# Patient Record
Sex: Female | Born: 1970 | Race: White | Hispanic: No | Marital: Married | Smoking: Never smoker
Health system: Southern US, Community
[De-identification: ages and names within clinical notes are randomized; demographics above are authoritative.]

## PROBLEM LIST (undated history)

## (undated) DIAGNOSIS — K2270 Barrett's esophagus without dysplasia: Secondary | ICD-10-CM

## (undated) DIAGNOSIS — K219 Gastro-esophageal reflux disease without esophagitis: Secondary | ICD-10-CM

## (undated) DIAGNOSIS — F410 Panic disorder [episodic paroxysmal anxiety] without agoraphobia: Secondary | ICD-10-CM

## (undated) DIAGNOSIS — E559 Vitamin D deficiency, unspecified: Secondary | ICD-10-CM

## (undated) DIAGNOSIS — M722 Plantar fascial fibromatosis: Secondary | ICD-10-CM

## (undated) DIAGNOSIS — M1990 Unspecified osteoarthritis, unspecified site: Secondary | ICD-10-CM

## (undated) DIAGNOSIS — F419 Anxiety disorder, unspecified: Secondary | ICD-10-CM

## (undated) HISTORY — PX: TONSILLECTOMY AND ADENOIDECTOMY: SUR1326

## (undated) HISTORY — PX: JOINT REPLACEMENT: SHX530

## (undated) HISTORY — DX: Vitamin D deficiency, unspecified: E55.9

## (undated) HISTORY — DX: Barrett's esophagus without dysplasia: K22.70

## (undated) HISTORY — PX: BREAST BIOPSY: SHX20

## (undated) HISTORY — DX: Gastro-esophageal reflux disease without esophagitis: K21.9

## (undated) HISTORY — PX: WISDOM TOOTH EXTRACTION: SHX21

## (undated) HISTORY — PX: TUBAL LIGATION: SHX77

---

## 1995-02-06 HISTORY — PX: ELBOW SURGERY: SHX618

## 1998-02-05 HISTORY — PX: CHOLECYSTECTOMY: SHX55

## 2000-02-06 HISTORY — PX: FRACTURE SURGERY: SHX138

## 2001-02-28 ENCOUNTER — Emergency Department (HOSPITAL_COMMUNITY): Admission: AC | Admit: 2001-02-28 | Discharge: 2001-02-28 | Payer: Self-pay

## 2002-03-19 ENCOUNTER — Other Ambulatory Visit: Admission: RE | Admit: 2002-03-19 | Discharge: 2002-03-19 | Payer: Self-pay | Admitting: Obstetrics & Gynecology

## 2002-10-16 ENCOUNTER — Encounter (INDEPENDENT_AMBULATORY_CARE_PROVIDER_SITE_OTHER): Payer: Self-pay

## 2002-10-16 ENCOUNTER — Inpatient Hospital Stay (HOSPITAL_COMMUNITY): Admission: RE | Admit: 2002-10-16 | Discharge: 2002-10-19 | Payer: Self-pay | Admitting: Obstetrics & Gynecology

## 2002-11-11 ENCOUNTER — Other Ambulatory Visit: Admission: RE | Admit: 2002-11-11 | Discharge: 2002-11-11 | Payer: Self-pay | Admitting: Obstetrics & Gynecology

## 2006-04-26 ENCOUNTER — Inpatient Hospital Stay (HOSPITAL_COMMUNITY): Admission: RE | Admit: 2006-04-26 | Discharge: 2006-04-29 | Payer: Self-pay | Admitting: Obstetrics & Gynecology

## 2007-02-06 HISTORY — PX: LAPAROSCOPIC GASTRIC BANDING: SHX1100

## 2007-02-13 ENCOUNTER — Ambulatory Visit (HOSPITAL_COMMUNITY): Admission: RE | Admit: 2007-02-13 | Discharge: 2007-02-13 | Payer: Self-pay | Admitting: Surgery

## 2007-02-24 ENCOUNTER — Ambulatory Visit (HOSPITAL_COMMUNITY): Admission: RE | Admit: 2007-02-24 | Discharge: 2007-02-24 | Payer: Self-pay | Admitting: Surgery

## 2007-02-27 ENCOUNTER — Encounter: Admission: RE | Admit: 2007-02-27 | Discharge: 2007-05-28 | Payer: Self-pay | Admitting: Surgery

## 2007-04-11 ENCOUNTER — Ambulatory Visit (HOSPITAL_COMMUNITY): Admission: RE | Admit: 2007-04-11 | Discharge: 2007-04-11 | Payer: Self-pay | Admitting: Surgery

## 2007-04-28 ENCOUNTER — Encounter (INDEPENDENT_AMBULATORY_CARE_PROVIDER_SITE_OTHER): Payer: Self-pay | Admitting: Surgery

## 2007-04-28 ENCOUNTER — Ambulatory Visit (HOSPITAL_COMMUNITY): Admission: RE | Admit: 2007-04-28 | Discharge: 2007-04-29 | Payer: Self-pay | Admitting: Surgery

## 2007-06-18 ENCOUNTER — Encounter: Admission: RE | Admit: 2007-06-18 | Discharge: 2007-06-18 | Payer: Self-pay | Admitting: Obstetrics and Gynecology

## 2008-08-23 ENCOUNTER — Ambulatory Visit: Payer: Self-pay | Admitting: Diagnostic Radiology

## 2008-08-23 ENCOUNTER — Emergency Department (HOSPITAL_BASED_OUTPATIENT_CLINIC_OR_DEPARTMENT_OTHER): Admission: EM | Admit: 2008-08-23 | Discharge: 2008-08-23 | Payer: Self-pay | Admitting: Emergency Medicine

## 2008-08-26 ENCOUNTER — Encounter: Admission: RE | Admit: 2008-08-26 | Discharge: 2008-08-26 | Payer: Self-pay | Admitting: Surgery

## 2008-10-04 IMAGING — RF DG UGI W/ KUB
14 of 23 series · 14 of 23 positions shown · non-contrast
Comparison: none

CLINICAL DATA: Bariatric screening. History of hiatal hernia and ulcers. Patient on medications for acid reflux.
 UPPER GI SERIES WITH KUB - HIGH DENSITY:

[Series 1: run · 1 of 1 slices shown (1 of 10)]
[im 1/1]
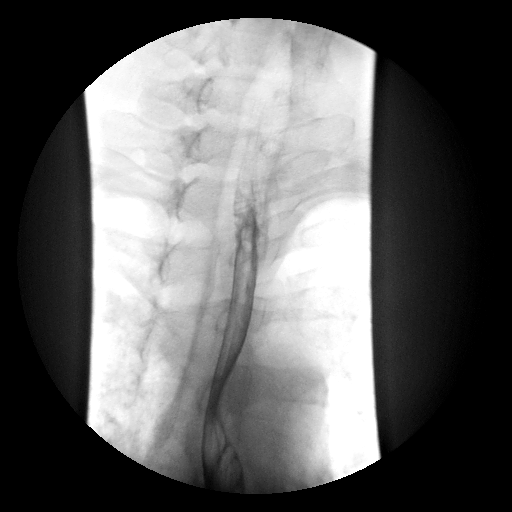

[Series 3: run · 1 of 1 slices shown (2 of 10)]
[im 1/1]
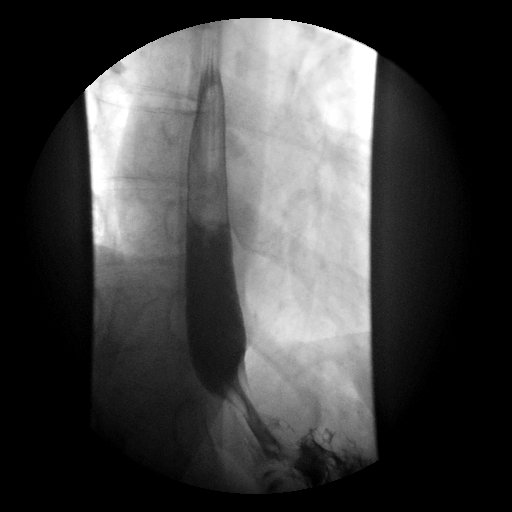

[Series 5: run · 1 of 1 slices shown (3 of 10)]
[im 1/1]
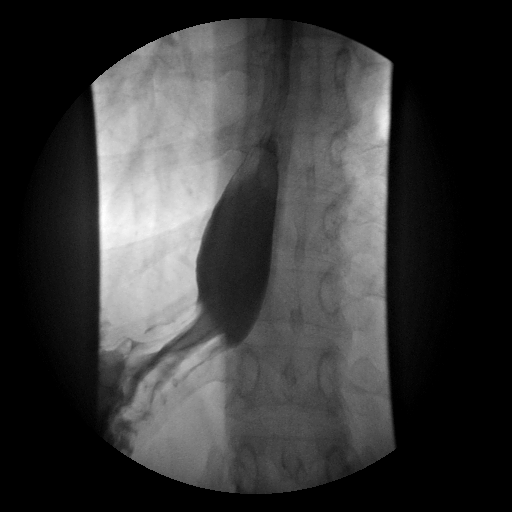

[Series 6: run · 1 of 1 slices shown (4 of 10)]
[im 1/1]
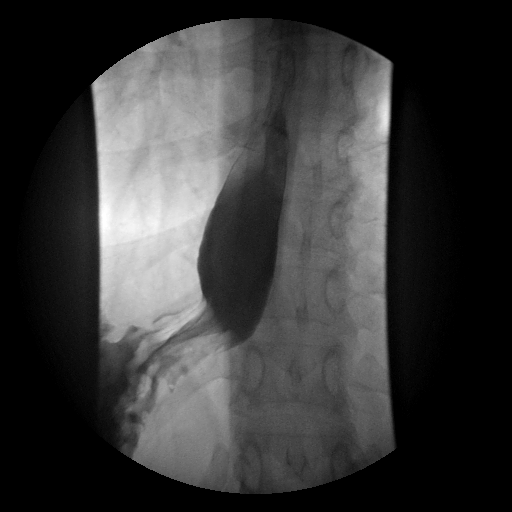

[Series 8: run · 1 of 1 slices shown (5 of 10)]
[im 1/1]
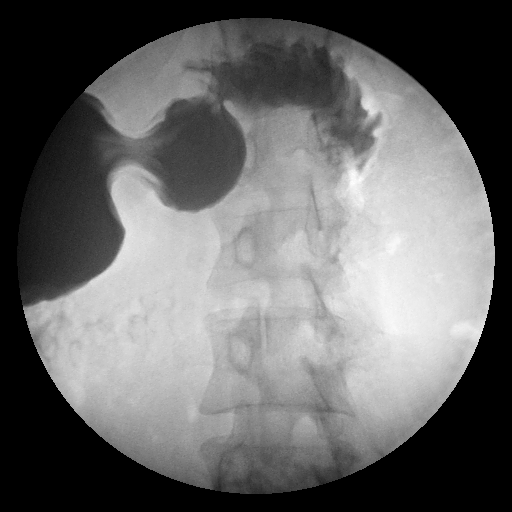

[Series 10: run · 1 of 1 slices shown (6 of 10)]
[im 1/1]
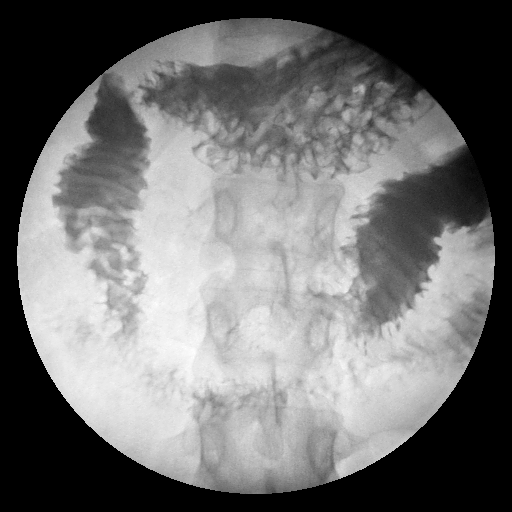

[Series 11: run · 1 of 1 slices shown (7 of 10)]
[im 1/1]
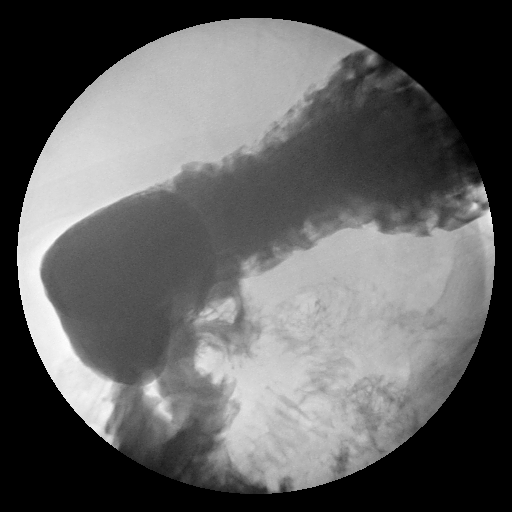

[Series 13: run · 1 of 1 slices shown (8 of 10)]
[im 1/1]
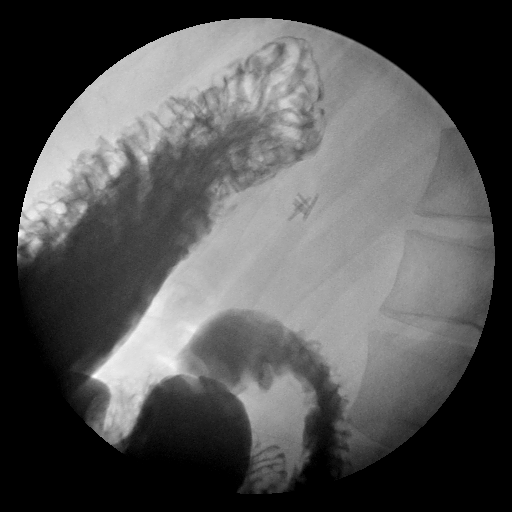

[Series 14: run · 1 of 1 slices shown (9 of 10)]
[im 1/1]
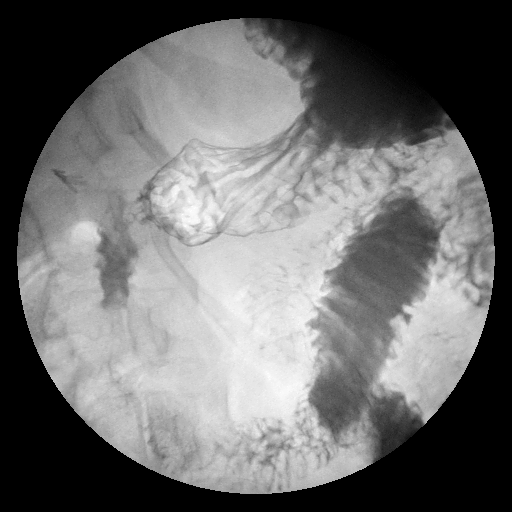

[Series 16: run · 1 of 1 slices shown (10 of 10)]
[im 1/1]
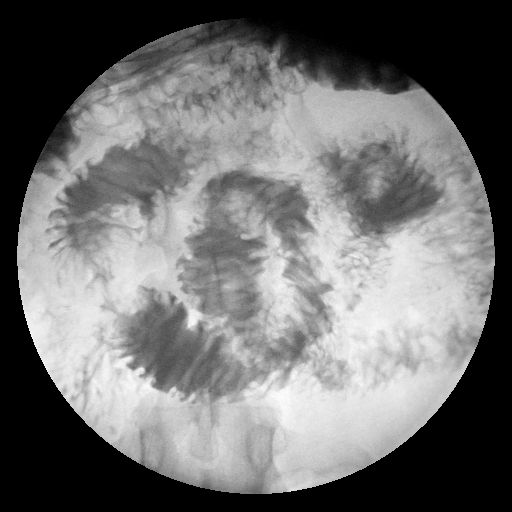

[Series 1002: view not recorded · 0.20mm/px · 1 of 1 slices shown (1 of 4)]
[im 1/1]
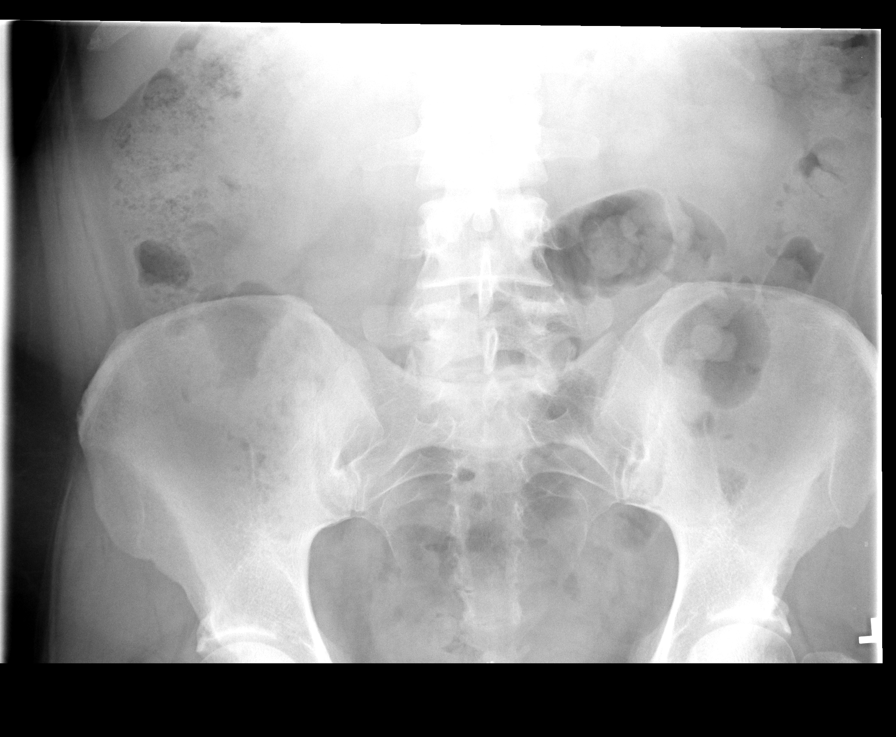

[Series 1003: view not recorded · 0.20mm/px · 1 of 1 slices shown (2 of 4)]
[im 1/1]
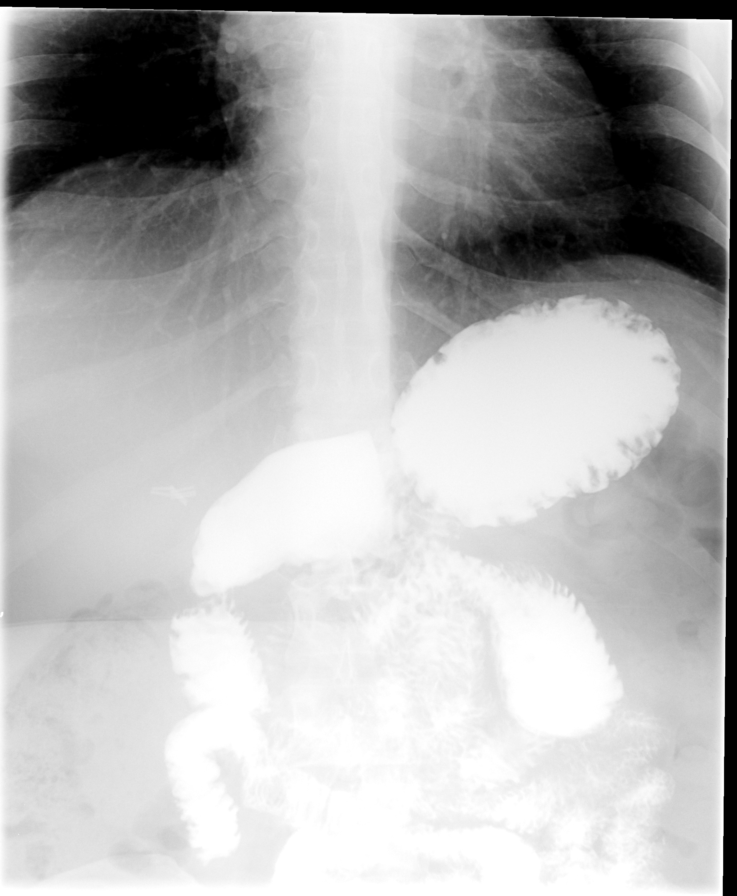

[Series 1005: view not recorded · 0.20mm/px · 1 of 1 slices shown (3 of 4)]
[im 1/1]
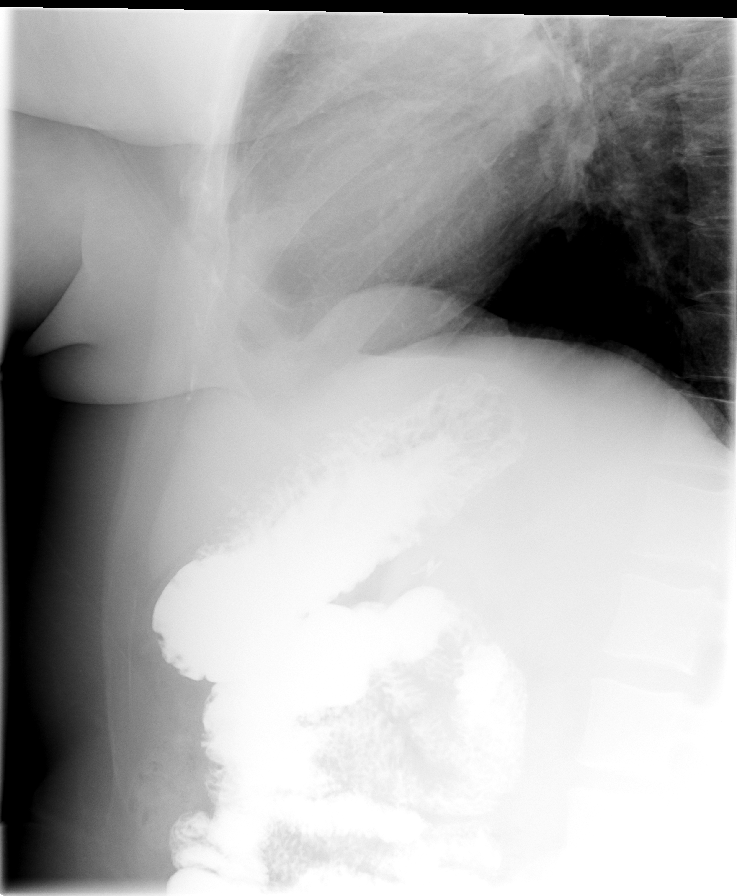

[Series 1007: view not recorded · 0.20mm/px · 1 of 1 slices shown (4 of 4)]
[im 1/1]
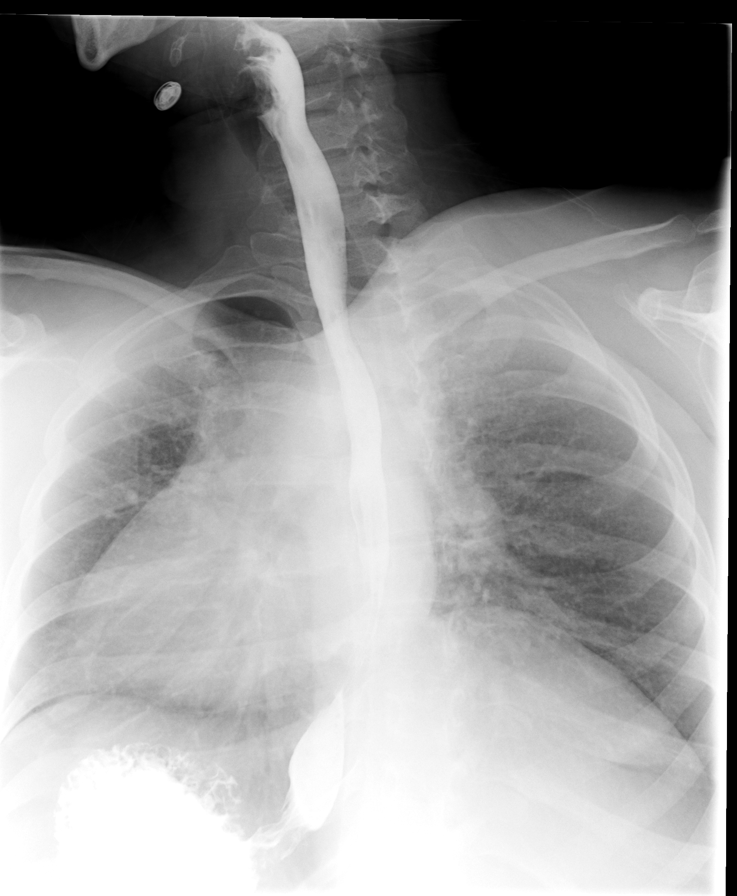

[14 of 23 positions shown; findings below may reference images not displayed]

FINDINGS: Scout film ? A nonobstructive bowel gas pattern is seen. No abnormal calcifications are noted.  Surgical clips are identified in the right upper quadrant post cholecystectomy and a surgical clip is seen in the central portion of the lower pelvis of questionable etiology. The bony structures appear intact. 
 The patient was able to swallow without difficulty. The esophageal mucosa and motility appeared within normal limits.  No evidence for hiatal hernia or gastroesophageal reflux was seen even with elicitation maneuvers. The gastric mucosa was well coated, and showed no evidence for focal abnormality.  Motility through the stomach and pylorus and duodenum appeared unremarkable.  The duodenal bulb was well distended and has a normal appearance as did the visualized portion of the small bowel to the level of the mid jejunum.
IMPRESSION: Normal Upper GI.

## 2009-04-22 ENCOUNTER — Encounter: Admission: RE | Admit: 2009-04-22 | Discharge: 2009-04-22 | Payer: Self-pay | Admitting: Surgery

## 2010-02-05 NOTE — L&D Delivery Note (Signed)
Cesarean Section Procedure Note with modified Pomeroy bilateral tubal sterilization:  Indications: previous uterine incision kerr x2                       Pre-operative Diagnosis: 39 week 3 day pregnancy.  Desires sterilization  Post-operative Diagnosis: same  Surgeon: Genia Del   Assistants: Marlinda Mike  Anesthesia: Spinal anesthesia  ASA Class: 2   Procedure Details   The patient was seen in the Holding Room. The risks, benefits, complications, treatment options, and expected outcomes were discussed with the patient.  The patient concurred with the proposed plan, giving informed consent.  The site of surgery properly noted/marked. The patient was taken to Operating Room # 9, identified as Brianna Allen and the procedure verified as C-Section Delivery. A Time Out was held and the above information confirmed.  After induction of anesthesia, the patient was draped and prepped in the usual sterile manner. A Pfannenstiel incision was made and carried down through the subcutaneous tissue to the fascia. Fascial incision was made and extended transversely. The fascia was separated from the underlying rectus tissue superiorly and inferiorly. The peritoneum was identified and entered. Peritoneal incision was extended longitudinally. The utero-vesical peritoneal reflection was incised transversely and the bladder flap was bluntly freed from the lower uterine segment. A low transverse uterine incision was made. Delivered from cephalic presentation was a 9+ Lbs Female with Apgar scores of 9 at one minute and 9 at five minutes. After the umbilical cord was clamped and cut cord blood was obtained for evaluation. The placenta was removed intact and appeared normal. The uterine outline, tubes and ovaries appeared normal. The uterine incision was closed with running locked sutures of Vicryl 0.  A second layer with a mattress suture of Vicryl 0 was added. Hemostasis was observed.  A bilateral  sterilization of the tubes was done by modified Pomeroy technique with plain.  Lavage was carried out until clear.  The peritoneum was closed with a running suture of Vicryl 2-0. The fascia was then reapproximated with running sutures of Vicryl 0.  The adipose tissue was reapproximated with a running plain suture. The skin was reapproximated with Staples.  Instrument, sponge, and needle counts were correct prior the abdominal closure and at the conclusion of the case.   Findings: Normal placenta  Estimated Blood Loss:  800 cc         Drains: none         Total IV Fluids:  3200 ml         Specimens: Placenta          Complications:  None; patient tolerated the procedure well.         Disposition: PACU - hemodynamically stable.         Condition: stable  Attending Attestation: I was present and scrubbed for the entire procedure.   Dr Genia Del.  01/10/11 at 12:55 pm

## 2010-05-14 LAB — COMPREHENSIVE METABOLIC PANEL
AST: 16 U/L (ref 0–37)
Albumin: 4.5 g/dL (ref 3.5–5.2)
BUN: 7 mg/dL (ref 6–23)
Calcium: 9.7 mg/dL (ref 8.4–10.5)
Chloride: 106 mEq/L (ref 96–112)
Creatinine, Ser: 0.7 mg/dL (ref 0.4–1.2)
GFR calc non Af Amer: >60 mL/min (ref >60)
Total Bilirubin: 0.6 mg/dL (ref 0.3–1.2)

## 2010-05-14 LAB — DIFFERENTIAL
Basophils Absolute: 0 10*3/uL (ref 0.0–0.1)
Basophils Relative: 1 % (ref 0–1)
Lymphocytes Relative: 22 % (ref 12–46)
Monocytes Absolute: 0.3 10*3/uL (ref 0.1–1.0)
Monocytes Relative: 5 % (ref 3–12)
Neutro Abs: 4.8 10*3/uL (ref 1.7–7.7)
Neutrophils Relative %: 72 % (ref 43–77)

## 2010-05-14 LAB — CBC
HCT: 43.8 % (ref 36.0–46.0)
Hemoglobin: 14.7 g/dL (ref 12.0–15.0)
MCV: 92.1 fL (ref 78.0–100.0)
Platelets: 230 10*3/uL (ref 150–400)
RDW: 12.6 % (ref 11.5–15.5)

## 2010-06-07 LAB — ABO/RH: RH Type: POSITIVE

## 2010-06-07 LAB — RUBELLA ANTIBODY, IGM: Rubella: IMMUNE

## 2010-06-08 LAB — ANTIBODY SCREEN: Antibody Screen: NEGATIVE

## 2010-06-08 LAB — HIV ANTIBODY (ROUTINE TESTING W REFLEX): HIV: NONREACTIVE

## 2010-06-20 NOTE — Op Note (Signed)
NAMEELLOWYN, RIEVES             ACCOUNT NO.:  [id number]   MEDICAL RECORD NO.:  [id number]          PATIENT TYPE:  AMB   LOCATION:  DAY                          FACILITY:  [facility]   PHYSICIAN:  Sandria Bales. Ezzard Standing, M.D.  DATE OF BIRTH:  January 04, 1971   DATE OF PROCEDURE:  04/28/2007  DATE OF DISCHARGE:                               OPERATIVE REPORT   Operative date here?   PREOPERATIVE DIAGNOSIS:  Morbid obesity (weight of 254, body mass index  of 40), 1 cm right breast skin lesion.   POSTOPERATIVE DIAGNOSIS:  Morbid obesity (weight 254, body mass index  40), 1 cm right breast skin lesion.   PROCEDURE:  Laparoscopic band (AP standard), excision of right breast  skin lesion.   SURGEON:  Sandria Bales. Ezzard Standing, M.D.   ASSISTANT:  Sharlet Salina T. Hoxworth, M.D.   ANESTHESIA:  General endotracheal with approximately 30 mL of 1%  Xylocaine.   COMPLICATIONS:  None.   PROCEDURE:  Ms. Brianna Allen is a 40 year old white female, a patient of  Lori Bean, P.A.,  who has been morbidly obese much of her adult life.  She has been through our preoperative bariatric program.  She is  interested in a laparoscopic banding procedure.  The indications and  potential complications of the procedure were explained to the patient.   Potential complications of the lap band were discussed.  These include,  but are not limited to, bleeding, infection, band slippage and erosion,  and long-term nutritional consequences.  She also has a 1 cm skin lesion  on her right breast at the 10 o'clock position she wanted excised at the  same time, and the risks of that include bleeding and infection.   OPERATIVE NOTE:  The patient was placed in the supine position and given  a general endotracheal anesthetic.  She was given 1 gram of Ancef at  initiation of procedure.  Her abdomen was prepped with Techni-Care.  She  had PAS stockings in place.  A timeout was held, identifying the patient  and the procedure.  I had marked the right  breast where the skin lesion  was preoperatively.   I accessed her abdominal cavity through the left upper quadrant using  Optiview 11-mm Ethicon trocar.  I then placed five additional trocars, a  5 mm subxiphoid trocar for the liver retractor, a 15 mm trocar in the  right subcostal margin, an 11 mm right paramedian, an 11 mm left  paramedian, and a 5 mm left lateral subcostal trocar.   Abdominal exploration was carried out.  The patient had a moderate  amount of omentum.  I could see her left ovary which was really  unremarkable.  I could see the top of the uterus.  Her right and left  lobe of the liver were unremarkable.  Her stomach was unremarkable.   I then turned my attention to the stomach.  I made an incision along the  gastroesophageal angle on the left side at the angle of Hiss and  developed this along the left crus.  I then went through the  gastrohepatic ligament, identified the right crus,  and went down to  where the fatty fibers decussate across the crus and entered this area.  Then I passed the finger dissector through in a retrogastric function up  to the angle of Hiss.   I then introduced the AP standard band.  I passed the band through the  finger and pulled it around the proximal stomach.   Before cinching it down, I passed the sizer down the esophagus and  singed the band over the sizer and it seemed to fit okay.  The sizer was  then removed.  I then imbricated the stomach over the band using three  sutures of 2-0 Ethibond suture.   There was a little bruising on the first suture, but I thought I got  good bites and this covered the band well.   I then re-inspected the band and its location.  I pulled the tubing out  through the right paramedian incision.  I then removed the liver  retractor and all the other retractors in turn.  I pulled the tubing  attachment to the reservoir device.  I then sewed the reservoir device  with 2-0 Prolene sutures in four  quarters on to the rectus fascia.  I  measured the amount of fluid in the band.  There up 2.5 mL of fluid in  the band.  So I just pulled some of the air out but I reinserted that  fluid.   I then closed the subcutaneous tissue with 3-0 Vicryl suture and the  skin with 5-0 Monocryl suture and painted each wound with Dermabond.   I then turned my attention to the right breast which was prepped with  Betadine solution.  This was a little papilloma and I just snipped off  the bottom and burned the base with Bovie cautery.  I did send the  specimen for pathology.   The patient tolerated the procedure well and was transported to the  recovery room in good condition.  Sponge and needle count were correct.      Sandria Bales. Ezzard Standing, M.D.  Electronically Signed     DHN/MEDQ  D:  04/28/2007  T:  04/28/2007  Job:  [id number]   cc:   Genia Del, M.D.  Fax: [phone]   Jilda Roche, P.A.

## 2010-06-23 NOTE — Op Note (Signed)
Brianna Allen, Brianna Allen             ACCOUNT NO.:  [id number]   MEDICAL RECORD NO.:  [id number]          PATIENT TYPE:  INP   LOCATION:  9127                          FACILITY:  [facility]   PHYSICIAN:  Genia Del, M.D.DATE OF BIRTH:  09/22/1970   DATE OF PROCEDURE:  DATE OF DISCHARGE:                               OPERATIVE REPORT   PREOPERATIVE DIAGNOSES:  38 plus weeks gestation, previous C-section.   POSTOPERATIVE DIAGNOSES:  38 plus weeks gestation, previous C-section.   PROCEDURE:  Repeat elective low transverse C-section.   SURGEON:  Dr. Genia Del.   ASSISTANT:  No assistant.   ANESTHESIOLOGIST:  Octaviano Glow. Pamalee Leyden, M.D.   DESCRIPTION OF PROCEDURE:  Under spinal anesthesia with a 15 degree left  decubitus position, the patient is prepped with Betadine on the  abdominal suprapubic vulvar and vaginal areas.  The bladder is  catheterized and the patient is draped as usual.  We verify the level of  anesthesia which is adequate.  We then infiltrate the subcutaneous  tissue with Marcaine one-quarter plain, 20 mL at the site of the  previous C-section scar.  We then make a Pfannenstiel incision at the  site of the previous C-section scar.  We opened the adipose tissue with  the scalpel.  We open the aponeurosis with the Bovie and the Mayo  scissors.  We then separate the recti muscles from the aponeurosis on  the midline.  We opened the parietoperitoneum longitudinally with [employer]  scissors.  We opened the visceral peritoneum over the lower uterine  segment transversely with Metz scissors and reclined the bladder  downward.  The bladder retractor is put in place.  We make a low  transverse hysterotomy over the lower uterine segment with a scalpel  extension on each side with dressing scissors.  The amniotic fluid is  clear.  The fetus is in cephalic presentation.  The birth of a baby girl  at 12:24. The cord is clamped and cut. The baby is suctioned and given  to the  neonatal team.  Apgar's are 9 and 9. Weight is 8 pounds 9 ounces.  The placenta is evacuated spontaneously and given for cord blood  banking. The uterine revision is done Pitocin is started in the IV  fluid.  The uterus contracts well. Ancef  1 gram IV is given.  We then  close the hysterotomy in a first locked running suture of #0 Vicryl,  a  second layer in a mattress fashion is done with a #0 Vicryl. We make a  figure-of-eight #0 Vicryl stitch on the right aspect of the incision to  complete hemostasis.  Both tubes and both ovaries are normal to  inspection.  We then irrigate and suction the abdominopelvic cavity.  We  complete hemostasis with the electrocautery at the level of the bladder  flap and at the recti muscles.  We then close the aponeurosis in two  half running sutures of #0 Vicryl.  Hemostasis is completed on the  adipose tissue with the electrocautery and the skin is reapproximated  with  staples.  A dry dressing is applied.  The  counts of the sponges and  instruments was complete x2.  The estimated blood loss was 800 mL.  No  complications occurred and the patient was brought to the recovery room  in good stable status.      Genia Del, M.D.  Electronically Signed     ML/MEDQ  D:  04/26/2006  T:  04/26/2006  Job:  [id number]

## 2010-06-23 NOTE — Discharge Summary (Signed)
NAMEJERNEY, Brianna Allen             ACCOUNT NO.:  [id number]   MEDICAL RECORD NO.:  [id number]         PATIENT TYPE:  INP   LOCATION:  9304                          [facility]ACILITY:  [facility]   PHYSICIAN:  Genia Del, M.D.DATE OF BIRTH:  09/19/1976   DATE OF ADMISSION:  04/28/2006  DATE OF DISCHARGE:  04/29/2006                               DISCHARGE SUMMARY   ADMISSION DIAGNOSES:  1. Thirty-eight week plus gestation.  2. Previous cesarean section.   DISCHARGE DIAGNOSES:  1. Thirty-eight week plus gestation.  2. Previous cesarean section.   INTERVENTION:  Repeat elective low transverse cesarean section.   HOSPITAL COURSE:  The patient had her C-section on April 26, 2006.  She  had a baby girl.  No complication with the C-section.  Estimated blood  loss 800 mL.  Ancef 1 g IV was given during the surgery.  The postop  course was unremarkable.  She remained afebrile and hemodynamically  stable.  She was discharged to home on postop day #3 in good, stable  status.  Postoperative advice was given.  The patient will follow up for  staple removal on day 5-7 postop and then will have a postop visit at 6  weeks.      Genia Del, M.D.  Electronically Signed     ML/MEDQ  D:  06/20/2006  T:  06/20/2006  Job:  [id number]

## 2010-06-23 NOTE — Discharge Summary (Signed)
Brianna, Allen             ACCOUNT NO.:  [id number]   MEDICAL RECORD NO.:  [id number]         PATIENT TYPE:  INP   LOCATION:  9304                          [facility]ACILITY[facility]   PHYSICIAN:  Genia Del, M.D.DATE OF BIRTH:  09/19/1976   DATE OF ADMISSION:  04/28/2006  DATE OF DISCHARGE:  04/29/2006                               DISCHARGE SUMMARY   I cut myself short at the end of the dictation, so I wanted to add that  her post-op hemoglobin was 11.1 with a hematocrit of 32.7, that she was  discharged in stable status, and post-op advice were given.  She will  follow up for staple removal day 5 to 7 and then for post-op visit at 6  weeks at [facility].      Genia Del, M.D.  Electronically Signed     ML/MEDQ  D:  06/20/2006  T:  06/20/2006  Job:  [id number]

## 2010-06-23 NOTE — Discharge Summary (Signed)
   NAME:  Brianna Allen, Brianna Allen                       ACCOUNT NO.:  [id number]   MEDICAL RECORD NO.:  [id number]                   PATIENT TYPE:  INP   LOCATION:  9141                                 [facility]ACILITY[facility]   PHYSICIAN:  Genia Del, M.D.             DATE OF BIRTH:  May 07, 1970   DATE OF ADMISSION:  10/16/2002  DATE OF DISCHARGE:  10/19/2002                                 DISCHARGE SUMMARY   ADMISSION DIAGNOSES:  38 weeks and 6 days gestation with suspicion of severe  macrosomia.   DISCHARGE DIAGNOSES:  38 weeks and 6 days gestation with suspicion of severe  macrosomia. Concern large for gestational age at 9 pounds.   INTERVENTION:  Primary elective low transverse cesarean section.   HOSPITAL COURSE:  Brianna Allen is a 40 year old who was admitted at 63 weeks  and 6 days gestation with suspicion of macrosomia at more than the 99th  percentile by ultrasound. A primary elective cesarean section was decided.  Her presentation was unfavorable with a closed, long, and high presentation.  She had an elective low transverse cesarean section on October 16, 2002  under spinal anesthesia. She had a baby girl weighing 9 pounds, Apgar's 9  and 9. Cord blood was taken for blood bank. Her estimated blood loss was 1  liter. No complications occurred. Postoperative evaluation was unremarkable.  She remained afebrile and hemodynamically stable.   LABORATORY DATA:  Her postoperative hemoglobin was 10.7, hematocrit 30.5.   DISPOSITION:  She was discharged home on postoperative day 3 in stable  status. Postoperative advice given.   DISCHARGE MEDICATIONS:  She was prescribed Tylox and Motrin p.r.n.   FOLLOW UP:  She will followup at [facility] in 4 weeks.                                               Genia Del, M.D.    ML/MEDQ  D:  11/12/2002  T:  11/12/2002  Job:  [id number]

## 2010-06-23 NOTE — Op Note (Signed)
NAME:  Brianna Allen, Brianna Allen                       ACCOUNT NO.:  [id number]   MEDICAL RECORD NO.:  [id number]                   PATIENT TYPE:  INP   LOCATION:  9141                                 [facility]ILITY[facility]   PHYSICIAN:  Genia Del, M.D.             DATE OF BIRTH:  [birth year]   DATE OF PROCEDURE:  10/16/2002  DATE OF DISCHARGE:                                 OPERATIVE REPORT   PREOPERATIVE DIAGNOSIS:  A 38-weeks and six days gestation, with suspicion  of severe macrosomia.   POSTOPERATIVE DIAGNOSES:  1. A 38-weeks and six days gestation, with suspicion of severe macrosomia.  2. Delivery of baby girl, 9 pounds.   INTERVENTION:  Primary elective low transverse cesarean section.   SURGEON:  Genia Del, M.D.   ASSISTANT:  Derek Jack.   ANESTHESIOLOGIST:  Quillian Quince, M.D.   DESCRIPTION OF PROCEDURE:  Under spinal anesthesia, with 15-degree left  decubitus position, the patient is prepped with Hibiclens on the abdominal,  suprapubic, vulva and vaginal areas.  The bladder catheter is inserted and  the patient is draped as usual.  An infiltration of 10 cc Marcaine 0.25%  plain is done at the incision site subcutaneously.  We then do a  Pfannenstiel incision with a scalpel.  We opened the aponeurosis  transversely with the Mayo scissors.  The aponeurosis is separated from the  recti muscles on the midline superiorly and inferiorly.  We then opened the  parietoperitoneum bluntly.  We put the bladder retractor in place.  The  visceroperitoneum is opened transversely with Metzenbaum scissors over the  lower uterine segment.  The bladder is reclined downwards.  The bladder  retractor is repositioned.  We opened a low transverse hysterotomy with the  scalpel, and then elongated on each side with scissors.   The amniotic fluid is clear.  The baby is in cephalic presentation.  A baby  girl.  The cord is clamped and cut.  The baby is given to the neonatal team.  Apgars are 9 and 9.  The weight is 9 pounds.  Cord blood bank is taken, and  then cord blood is done.  We have a spontaneous evacuation of the placenta,  which is sent to pathology.  We then do a uterine revision.  The uterus is  contracting well with Pitocin, and Cefotetan 1 g is given.  The ____________  is closed in a closed locked running suture of 0 Vicryl.  A second plane in  a mattress fashion is done with 0 Vicryl.  Hemostasis is adequate.  Both  tubes and ovaries are normal bilaterally.  The uterus is put back in place.  Irrigation and suction is done of the abdominal pelvic cavity.  Hemostasis  is verified over the lower uterine segment.  The bladder flap at recti  muscles is completed with the electrocautery when necessary.  The  aponeurosis is then closed in two half running  suture of 0 Vicryl.  Hemostasis is completed at the level of the adipose tissue with the  electrocautery.  The skin is reapproximated with staples.  A dry dressing is  applied.  The count of instruments and gauzes was complete x2.   ESTIMATED BLOOD LOSS:  1000 cc.    COMPLICATIONS:  None occurs.   DISPOSITION:  The patient was transferred to the recovery room in  satisfactory condition.                                               Genia Del, M.D.    ML/MEDQ  D:  10/16/2002  T:  10/17/2002  Job:  [id number]

## 2010-10-30 LAB — CBC
Platelets: 219
RDW: 13.7
WBC: 7.8

## 2010-10-30 LAB — DIFFERENTIAL
Basophils Absolute: 0
Lymphocytes Relative: 32
Lymphs Abs: 2.5
Neutro Abs: 4.5

## 2010-10-30 LAB — PREGNANCY, URINE

## 2010-10-30 LAB — HEMOGLOBIN AND HEMATOCRIT, BLOOD: HCT: 39.3

## 2010-12-21 ENCOUNTER — Encounter (HOSPITAL_COMMUNITY): Payer: Self-pay | Admitting: Pharmacy Technician

## 2010-12-26 ENCOUNTER — Encounter (HOSPITAL_COMMUNITY): Payer: Self-pay

## 2011-01-01 ENCOUNTER — Encounter (HOSPITAL_COMMUNITY): Payer: Self-pay

## 2011-01-01 ENCOUNTER — Encounter (HOSPITAL_COMMUNITY)
Admission: RE | Admit: 2011-01-01 | Discharge: 2011-01-01 | Disposition: A | Discharge status: Home / Self Care | Payer: BC Managed Care – PPO | Source: Ambulatory Visit | Attending: Obstetrics & Gynecology | Admitting: Obstetrics & Gynecology

## 2011-01-01 ENCOUNTER — Other Ambulatory Visit: Payer: Self-pay | Admitting: Obstetrics & Gynecology

## 2011-01-01 HISTORY — DX: Anxiety disorder, unspecified: F41.9

## 2011-01-01 HISTORY — DX: Panic disorder (episodic paroxysmal anxiety): F41.0

## 2011-01-01 HISTORY — DX: Unspecified osteoarthritis, unspecified site: M19.90

## 2011-01-01 LAB — SURGICAL PCR SCREEN: MRSA, PCR: NEGATIVE

## 2011-01-01 LAB — CBC
HCT: 42.3 % (ref 36.0–46.0)
Hemoglobin: 14.7 g/dL (ref 12.0–15.0)
MCH: 31.5 pg (ref 26.0–34.0)
MCHC: 34.8 g/dL (ref 30.0–36.0)
MCV: 90.6 fL (ref 78.0–100.0)

## 2011-01-01 NOTE — Patient Instructions (Addendum)
   Your procedure is scheduled on:Wednesday,  December 5th  Enter through the [employer] [facility] at:9:30am Pick up the phone at the desk and dial [phone] and inform us of your arrival.  Please call this number if you have any problems the morning of surgery: [phone]  Remember: Do not eat food after midnight:Tuesday Do not drink clear liquids after:Midnight Tuesday Take these medicines the morning of surgery with a SIP OF WATER:none  Do not wear jewelry, make-up, or FINGER nail polish Do not wear lotions, powders, or perfumes.  You may not  wear deodorant. Do not shave 48 hours prior to surgery. Do not bring valuables to the hospital.  Leave suitcase in the car. After Surgery it may be brought to your room. For patients being admitted to the hospital, checkout time is 11:00am the day of discharge.  Remember to use your hibiclens as instructed.Please shower with 1/2 bottle the evening before your surgery and the other 1/2 bottle the morning of surgery.

## 2011-01-01 NOTE — Pre-Procedure Instructions (Addendum)
Notified Alison Stalling at [facility] office of b/p 148/98-nurse will call pt

## 2011-01-02 ENCOUNTER — Other Ambulatory Visit: Payer: Self-pay | Admitting: Obstetrics & Gynecology

## 2011-01-10 ENCOUNTER — Encounter (HOSPITAL_COMMUNITY)
Admission: RE | Disposition: A | Discharge status: Home / Self Care | Payer: Self-pay | Source: Ambulatory Visit | Attending: Obstetrics & Gynecology

## 2011-01-10 ENCOUNTER — Encounter (HOSPITAL_COMMUNITY): Payer: Self-pay | Admitting: Anesthesiology

## 2011-01-10 ENCOUNTER — Encounter (HOSPITAL_COMMUNITY): Payer: Self-pay | Admitting: *Deleted

## 2011-01-10 ENCOUNTER — Inpatient Hospital Stay (HOSPITAL_COMMUNITY)
Admission: RE | Admit: 2011-01-10 | Discharge: 2011-01-12 | DRG: 371 | Disposition: A | Discharge status: Home / Self Care | Payer: BC Managed Care – PPO | Source: Ambulatory Visit | Attending: Obstetrics & Gynecology | Admitting: Obstetrics & Gynecology

## 2011-01-10 ENCOUNTER — Other Ambulatory Visit: Payer: Self-pay | Admitting: Obstetrics & Gynecology

## 2011-01-10 ENCOUNTER — Inpatient Hospital Stay (HOSPITAL_COMMUNITY): Payer: BC Managed Care – PPO | Admitting: Anesthesiology

## 2011-01-10 DIAGNOSIS — O3660X0 Maternal care for excessive fetal growth, unspecified trimester, not applicable or unspecified: Secondary | ICD-10-CM | POA: Diagnosis present

## 2011-01-10 DIAGNOSIS — Z01818 Encounter for other preprocedural examination: Secondary | ICD-10-CM

## 2011-01-10 DIAGNOSIS — O09529 Supervision of elderly multigravida, unspecified trimester: Secondary | ICD-10-CM | POA: Diagnosis present

## 2011-01-10 DIAGNOSIS — Z01812 Encounter for preprocedural laboratory examination: Secondary | ICD-10-CM

## 2011-01-10 DIAGNOSIS — Z302 Encounter for sterilization: Secondary | ICD-10-CM

## 2011-01-10 DIAGNOSIS — O34219 Maternal care for unspecified type scar from previous cesarean delivery: Principal | ICD-10-CM | POA: Diagnosis present

## 2011-01-10 DIAGNOSIS — O99844 Bariatric surgery status complicating childbirth: Secondary | ICD-10-CM | POA: Diagnosis present

## 2011-01-10 LAB — TYPE AND SCREEN
ABO/RH(D): O POS
Antibody Screen: NEGATIVE

## 2011-01-10 SURGERY — Surgical Case
Anesthesia: Spinal | Site: Abdomen | Wound class: Clean Contaminated

## 2011-01-10 MED ORDER — SODIUM CHLORIDE 0.9 % IV SOLN: 1.0000 ug/kg/h | INTRAVENOUS | Status: DC | PRN

## 2011-01-10 MED ORDER — LACTATED RINGERS IV SOLN
INTRAVENOUS | Status: DC
  Administered 2011-01-10: 18:00:00 via INTRAVENOUS

## 2011-01-10 MED ORDER — MORPHINE SULFATE 0.5 MG/ML IJ SOLN
INTRAMUSCULAR | Status: AC
  Filled 2011-01-10: qty 10

## 2011-01-10 MED ORDER — ONDANSETRON HCL 4 MG/2ML IJ SOLN: 4.0000 mg | Freq: Three times a day (TID) | INTRAMUSCULAR | Status: DC | PRN

## 2011-01-10 MED ORDER — KETOROLAC TROMETHAMINE 60 MG/2ML IM SOLN
INTRAMUSCULAR | Status: AC
  Filled 2011-01-10: qty 2

## 2011-01-10 MED ORDER — MORPHINE SULFATE (PF) 0.5 MG/ML IJ SOLN
INTRAMUSCULAR | Status: DC | PRN
  Administered 2011-01-10: 200 ug via INTRATHECAL

## 2011-01-10 MED ORDER — LACTATED RINGERS IV SOLN
INTRAVENOUS | Status: DC
  Administered 2011-01-10 (×4): via INTRAVENOUS

## 2011-01-10 MED ORDER — ONDANSETRON HCL 4 MG PO TABS: 4.0000 mg | ORAL_TABLET | ORAL | Status: DC | PRN

## 2011-01-10 MED ORDER — OXYCODONE-ACETAMINOPHEN 5-325 MG PO TABS
1.0000 | ORAL_TABLET | ORAL | Status: DC | PRN
  Administered 2011-01-11 (×2): 2 via ORAL
  Filled 2011-01-10 (×2): qty 2

## 2011-01-10 MED ORDER — LANOLIN HYDROUS EX OINT: 1.0000 "application " | TOPICAL_OINTMENT | CUTANEOUS | Status: DC | PRN

## 2011-01-10 MED ORDER — CEFAZOLIN SODIUM 1-5 GM-% IV SOLN: 1.0000 g | INTRAVENOUS | Status: DC

## 2011-01-10 MED ORDER — HYDROMORPHONE HCL PF 1 MG/ML IJ SOLN: 0.2500 mg | INTRAMUSCULAR | Status: DC | PRN

## 2011-01-10 MED ORDER — MORPHINE SULFATE 10 MG/ML IJ SOLN
INTRAMUSCULAR | Status: DC | PRN
  Administered 2011-01-10: 2 mg via INTRAVENOUS
  Administered 2011-01-10: 1 mg via INTRAVENOUS
  Administered 2011-01-10: .5 mg via INTRAVENOUS
  Administered 2011-01-10: 1.3 mg via INTRAVENOUS

## 2011-01-10 MED ORDER — IBUPROFEN 600 MG PO TABS: 600.0000 mg | ORAL_TABLET | Freq: Four times a day (QID) | ORAL | Status: DC | PRN

## 2011-01-10 MED ORDER — BUPIVACAINE IN DEXTROSE 0.75-8.25 % IT SOLN
INTRATHECAL | Status: DC | PRN
  Administered 2011-01-10: 1.6 mL via INTRATHECAL

## 2011-01-10 MED ORDER — WITCH HAZEL-GLYCERIN EX PADS: 1.0000 "application " | MEDICATED_PAD | CUTANEOUS | Status: DC | PRN

## 2011-01-10 MED ORDER — PROMETHAZINE HCL 25 MG/ML IJ SOLN: 6.2500 mg | INTRAMUSCULAR | Status: DC | PRN

## 2011-01-10 MED ORDER — DIPHENHYDRAMINE HCL 25 MG PO CAPS: 25.0000 mg | ORAL_CAPSULE | Freq: Four times a day (QID) | ORAL | Status: DC | PRN

## 2011-01-10 MED ORDER — NALOXONE HCL 0.4 MG/ML IJ SOLN: 0.4000 mg | INTRAMUSCULAR | Status: DC | PRN

## 2011-01-10 MED ORDER — IBUPROFEN 600 MG PO TABS
600.0000 mg | ORAL_TABLET | Freq: Four times a day (QID) | ORAL | Status: DC
  Administered 2011-01-11 – 2011-01-12 (×6): 600 mg via ORAL
  Filled 2011-01-10 (×6): qty 1

## 2011-01-10 MED ORDER — ONDANSETRON HCL 4 MG/2ML IJ SOLN
INTRAMUSCULAR | Status: DC | PRN
  Administered 2011-01-10: 4 mg via INTRAVENOUS

## 2011-01-10 MED ORDER — FENTANYL CITRATE 0.05 MG/ML IJ SOLN
INTRAMUSCULAR | Status: AC
  Filled 2011-01-10: qty 2

## 2011-01-10 MED ORDER — EPHEDRINE SULFATE 50 MG/ML IJ SOLN
INTRAMUSCULAR | Status: DC | PRN
  Administered 2011-01-10 (×2): 15 mg via INTRAVENOUS
  Administered 2011-01-10: 20 mg via INTRAVENOUS

## 2011-01-10 MED ORDER — NALBUPHINE HCL 10 MG/ML IJ SOLN
5.0000 mg | INTRAMUSCULAR | Status: DC | PRN
Start: 2011-01-10 — End: 2011-01-12

## 2011-01-10 MED ORDER — HYDROMORPHONE HCL PF 1 MG/ML IJ SOLN
1.0000 mg | Freq: Once | INTRAMUSCULAR | Status: AC
  Administered 2011-01-10: 1 mg via INTRAVENOUS
  Filled 2011-01-10 (×2): qty 1

## 2011-01-10 MED ORDER — SCOPOLAMINE 1 MG/3DAYS TD PT72
1.0000 | MEDICATED_PATCH | Freq: Once | TRANSDERMAL | Status: DC
  Administered 2011-01-10: 1.5 mg via TRANSDERMAL

## 2011-01-10 MED ORDER — OXYTOCIN 20 UNITS IN LACTATED RINGERS INFUSION - SIMPLE
INTRAVENOUS | Status: DC | PRN
  Administered 2011-01-10 (×2): 20 [IU] via INTRAVENOUS

## 2011-01-10 MED ORDER — HYDROMORPHONE HCL PF 1 MG/ML IJ SOLN
1.0000 mg | Freq: Once | INTRAMUSCULAR | Status: AC
  Administered 2011-01-10: 1 mg via INTRAVENOUS
  Filled 2011-01-10: qty 1

## 2011-01-10 MED ORDER — MAGNESIUM HYDROXIDE 400 MG/5ML PO SUSP
30.0000 mL | ORAL | Status: DC | PRN
  Administered 2011-01-12: 30 mL via ORAL
  Filled 2011-01-10: qty 30

## 2011-01-10 MED ORDER — CEFAZOLIN SODIUM 1-5 GM-% IV SOLN
INTRAVENOUS | Status: DC | PRN
  Administered 2011-01-10: 1 g via INTRAVENOUS

## 2011-01-10 MED ORDER — KETOROLAC TROMETHAMINE 30 MG/ML IJ SOLN
30.0000 mg | Freq: Four times a day (QID) | INTRAMUSCULAR | Status: AC | PRN
  Administered 2011-01-10: 30 mg via INTRAVENOUS
  Filled 2011-01-10: qty 1

## 2011-01-10 MED ORDER — SODIUM CHLORIDE 0.9 % IJ SOLN: 3.0000 mL | INTRAMUSCULAR | Status: DC | PRN

## 2011-01-10 MED ORDER — ONDANSETRON HCL 4 MG/2ML IJ SOLN
INTRAMUSCULAR | Status: AC
  Filled 2011-01-10: qty 2

## 2011-01-10 MED ORDER — FENTANYL CITRATE 0.05 MG/ML IJ SOLN
INTRAMUSCULAR | Status: DC | PRN
  Administered 2011-01-10: 100 ug via INTRAVENOUS
  Administered 2011-01-10: 75 ug via INTRAVENOUS
  Administered 2011-01-10: 25 ug via INTRATHECAL

## 2011-01-10 MED ORDER — KETOROLAC TROMETHAMINE 30 MG/ML IJ SOLN: 15.0000 mg | Freq: Once | INTRAMUSCULAR | Status: DC | PRN

## 2011-01-10 MED ORDER — BUPIVACAINE HCL (PF) 0.25 % IJ SOLN
INTRAMUSCULAR | Status: DC | PRN
  Administered 2011-01-10: 10 mL

## 2011-01-10 MED ORDER — ZOLPIDEM TARTRATE 5 MG PO TABS: 5.0000 mg | ORAL_TABLET | Freq: Every evening | ORAL | Status: DC | PRN

## 2011-01-10 MED ORDER — SENNOSIDES-DOCUSATE SODIUM 8.6-50 MG PO TABS
2.0000 | ORAL_TABLET | Freq: Every day | ORAL | Status: DC
  Administered 2011-01-11: 2 via ORAL

## 2011-01-10 MED ORDER — PRENATAL PLUS 27-1 MG PO TABS
1.0000 | ORAL_TABLET | Freq: Every day | ORAL | Status: DC
  Administered 2011-01-11 – 2011-01-12 (×2): 1 via ORAL
  Filled 2011-01-10 (×3): qty 1

## 2011-01-10 MED ORDER — DIPHENHYDRAMINE HCL 50 MG/ML IJ SOLN: 25.0000 mg | INTRAMUSCULAR | Status: DC | PRN

## 2011-01-10 MED ORDER — TETANUS-DIPHTH-ACELL PERTUSSIS 5-2.5-18.5 LF-MCG/0.5 IM SUSP: 0.5000 mL | Freq: Once | INTRAMUSCULAR | Status: DC

## 2011-01-10 MED ORDER — NALBUPHINE HCL 10 MG/ML IJ SOLN: 5.0000 mg | INTRAMUSCULAR | Status: DC | PRN

## 2011-01-10 MED ORDER — OXYTOCIN 10 UNIT/ML IJ SOLN
INTRAMUSCULAR | Status: AC
  Filled 2011-01-10: qty 2

## 2011-01-10 MED ORDER — ONDANSETRON HCL 4 MG/2ML IJ SOLN: 4.0000 mg | INTRAMUSCULAR | Status: DC | PRN

## 2011-01-10 MED ORDER — DIPHENHYDRAMINE HCL 50 MG/ML IJ SOLN: 12.5000 mg | INTRAMUSCULAR | Status: DC | PRN

## 2011-01-10 MED ORDER — KETOROLAC TROMETHAMINE 60 MG/2ML IM SOLN
60.0000 mg | Freq: Once | INTRAMUSCULAR | Status: AC | PRN
  Administered 2011-01-10: 60 mg via INTRAMUSCULAR

## 2011-01-10 MED ORDER — OXYTOCIN 20 UNITS IN LACTATED RINGERS INFUSION - SIMPLE: 125.0000 mL/h | INTRAVENOUS | Status: AC

## 2011-01-10 MED ORDER — MEPERIDINE HCL 25 MG/ML IJ SOLN: 6.2500 mg | INTRAMUSCULAR | Status: DC | PRN

## 2011-01-10 MED ORDER — DIBUCAINE 1 % RE OINT: 1.0000 "application " | TOPICAL_OINTMENT | RECTAL | Status: DC | PRN

## 2011-01-10 MED ORDER — SIMETHICONE 80 MG PO CHEW
80.0000 mg | CHEWABLE_TABLET | Freq: Three times a day (TID) | ORAL | Status: DC
  Administered 2011-01-11 – 2011-01-12 (×5): 80 mg via ORAL

## 2011-01-10 MED ORDER — DIPHENHYDRAMINE HCL 25 MG PO CAPS: 25.0000 mg | ORAL_CAPSULE | ORAL | Status: DC | PRN

## 2011-01-10 MED ORDER — FERROUS SULFATE 325 (65 FE) MG PO TABS
325.0000 mg | ORAL_TABLET | Freq: Two times a day (BID) | ORAL | Status: DC
  Administered 2011-01-11 – 2011-01-12 (×3): 325 mg via ORAL
  Filled 2011-01-10 (×3): qty 1

## 2011-01-10 MED ORDER — TETANUS-DIPHTH-ACELL PERTUSSIS 5-2.5-18.5 LF-MCG/0.5 IM SUSP
0.5000 mL | Freq: Once | INTRAMUSCULAR | Status: AC
  Administered 2011-01-10: 0.5 mL via INTRAMUSCULAR
  Filled 2011-01-10: qty 0.5

## 2011-01-10 MED ORDER — MENTHOL 3 MG MT LOZG: 1.0000 | LOZENGE | OROMUCOSAL | Status: DC | PRN

## 2011-01-10 MED ORDER — SIMETHICONE 80 MG PO CHEW: 80.0000 mg | CHEWABLE_TABLET | ORAL | Status: DC | PRN

## 2011-01-10 MED ORDER — KETOROLAC TROMETHAMINE 30 MG/ML IJ SOLN: 30.0000 mg | Freq: Four times a day (QID) | INTRAMUSCULAR | Status: AC | PRN

## 2011-01-10 SURGICAL SUPPLY — 31 items
CLOTH BEACON ORANGE TIMEOUT ST (SAFETY) ×2 IMPLANT
CONTAINER PREFILL 10% NBF 15ML (MISCELLANEOUS) IMPLANT
DRESSING TELFA 8X3 (GAUZE/BANDAGES/DRESSINGS) ×1 IMPLANT
DRSG PAD ABDOMINAL 8X10 ST (GAUZE/BANDAGES/DRESSINGS) ×1 IMPLANT
ELECT REM PT RETURN 9FT ADLT (ELECTROSURGICAL) ×2
ELECTRODE REM PT RTRN 9FT ADLT (ELECTROSURGICAL) ×1 IMPLANT
EXTRACTOR VACUUM M CUP 4 TUBE (SUCTIONS) IMPLANT
GLOVE BIO SURGEON STRL SZ 6.5 (GLOVE) ×4 IMPLANT
GOWN PREVENTION PLUS LG XLONG (DISPOSABLE) ×6 IMPLANT
KIT ABG SYR 3ML LUER SLIP (SYRINGE) IMPLANT
NDL HYPO 25X5/8 SAFETYGLIDE (NEEDLE) ×1 IMPLANT
NEEDLE HYPO 22GX1.5 SAFETY (NEEDLE) ×2 IMPLANT
NEEDLE HYPO 25X5/8 SAFETYGLIDE (NEEDLE) ×2 IMPLANT
PACK C SECTION WH (CUSTOM PROCEDURE TRAY) ×2 IMPLANT
RTRCTR C-SECT PINK 25CM LRG (MISCELLANEOUS) IMPLANT
SLEEVE SCD COMPRESS KNEE MED (MISCELLANEOUS) IMPLANT
SPONGE GAUZE 4X4 FOR O.R. (GAUZE/BANDAGES/DRESSINGS) ×1 IMPLANT
STAPLER VISISTAT 35W (STAPLE) IMPLANT
SUT PLAIN 0 NONE (SUTURE) IMPLANT
SUT VIC AB 0 CT1 27 (SUTURE) ×4
SUT VIC AB 0 CT1 27XBRD ANBCTR (SUTURE) ×2 IMPLANT
SUT VIC AB 0 CTX 36 (SUTURE) ×4
SUT VIC AB 0 CTX36XBRD ANBCTRL (SUTURE) ×2 IMPLANT
SUT VIC AB 2-0 CT1 27 (SUTURE) ×2
SUT VIC AB 2-0 CT1 TAPERPNT 27 (SUTURE) ×1 IMPLANT
SUT VIC AB 3-0 SH 27 (SUTURE)
SUT VIC AB 3-0 SH 27X BRD (SUTURE) IMPLANT
SYR CONTROL 10ML LL (SYRINGE) ×2 IMPLANT
TOWEL OR 17X24 6PK STRL BLUE (TOWEL DISPOSABLE) ×4 IMPLANT
TRAY FOLEY CATH 14FR (SET/KITS/TRAYS/PACK) ×2 IMPLANT
WATER STERILE IRR 1000ML POUR (IV SOLUTION) ×4 IMPLANT

## 2011-01-10 NOTE — Anesthesia Preprocedure Evaluation (Signed)
Anesthesia Evaluation  Patient identified by MRN, date of birth, ID band Patient awake    Reviewed: Allergy & Precautions, H&P , NPO status , Patient's Chart, lab work & pertinent test results  Airway Mallampati: II TM Distance: >3 FB Neck ROM: full    Dental No notable dental hx.    Pulmonary neg pulmonary ROS,    Pulmonary exam normal       Cardiovascular neg cardio ROS     Neuro/Psych Negative Neurological ROS  Negative Psych ROS   GI/Hepatic negative GI ROS, Neg liver ROS,   Endo/Other  Morbid obesity  Renal/GU negative Renal ROS  Genitourinary negative   Musculoskeletal   Abdominal (+) obese,   Peds negative pediatric ROS (+)  Hematology negative hematology ROS (+)   Anesthesia Other Findings   Reproductive/Obstetrics (+) Pregnancy                           Anesthesia Physical Anesthesia Plan  ASA: III  Anesthesia Plan: Spinal   Post-op Pain Management:    Induction:   Airway Management Planned:   Additional Equipment:   Intra-op Plan:   Post-operative Plan:   Informed Consent: I have reviewed the patients History and Physical, chart, labs and discussed the procedure including the risks, benefits and alternatives for the proposed anesthesia with the patient or authorized representative who has indicated his/her understanding and acceptance.     Plan Discussed with:   Anesthesia Plan Comments:         Anesthesia Quick Evaluation

## 2011-01-10 NOTE — Consult Note (Signed)
Called to attend a repeat C/S at term gestation with no risk factors other than potential risk for glucose imbalance. At birth infant in vertex presentation with a shoulder cord. Spontaneous cries at delivery with active movement and tone. Appears LGA and > 9 1/2 lbs.  Given tactile stimulation and bulb suction; no dysmorphic features.   Stooled and voided. Shown to parents then carried to Transitional Nursery . Care to assigned pediatrician.  Dagoberto Ligas MD [facility]

## 2011-01-10 NOTE — Transfer of Care (Signed)
Immediate Anesthesia Transfer of Care Note  Patient: Brianna Allen  Procedure(s) Performed:  CESAREAN SECTION - Repeat with  Bilateral tubal Litgation  Patient Location: PACU  Anesthesia Type: Spinal  Level of Consciousness: awake, alert  and oriented  Airway & Oxygen Therapy: Patient Spontanous Breathing  Post-op Assessment: Report given to PACU RN and Post -op Vital signs reviewed and stable  Post vital signs: Reviewed and stable  Complications: No apparent anesthesia complications

## 2011-01-10 NOTE — Anesthesia Postprocedure Evaluation (Signed)
Anesthesia Post Note  Patient: Brianna Allen  Procedure(s) Performed:  CESAREAN SECTION - Repeat with  Bilateral tubal Litgation  Anesthesia type: Spinal  Patient location: PACU  Post pain: Pain level controlled  Post assessment: Post-op Vital signs reviewed  Last Vitals:  Filed Vitals:   01/10/11 0939  BP: 144/97  Pulse: 78  Temp: 36.4 C  Resp: 18    Post vital signs: Reviewed  Level of consciousness: awake  Complications: No apparent anesthesia complications

## 2011-01-10 NOTE — Anesthesia Procedure Notes (Signed)
Spinal  Patient location during procedure: OR Start time: 01/10/2011 11:33 AM End time: 01/10/2011 11:36 AM Staffing Anesthesiologist: Sandrea Hughs Performed by: anesthesiologist  Preanesthetic Checklist Completed: patient identified, site marked, surgical consent, pre-op evaluation, timeout performed, IV checked, risks and benefits discussed and monitors and equipment checked Spinal Block Patient position: sitting Prep: DuraPrep Patient monitoring: heart rate, cardiac monitor, continuous pulse ox and blood pressure Approach: midline Location: L3-4 Injection technique: single-shot Needle Needle type: Sprotte  Needle gauge: 24 G Needle length: 9 cm Needle insertion depth: 7 cm Assessment Sensory level: T4

## 2011-01-10 NOTE — H&P (Signed)
Brianna Allen is a 40 y.o. female [phone] [redacted]w[redacted]d presenting for repeat C/S with BT/S.  OB History    Grav Para Term Preterm Abortions TAB SAB Ect Mult Living   3 2 2       2      Past Medical History  Diagnosis Date  . Anxiety   . Panic attacks   . Arthritis     right arm-titanium elbow joint-reduced flexion   Past Surgical History  Procedure Date  . Cesarean section [id number]  . Cholecystectomy 2000  . Elbow surgery 97    titanium plate-does not have full extension  . Laparoscopic gastric banding 2009   Family History: family history is not on file. Social History:  reports that she has never smoked. She does not have any smokeless tobacco history on file. She reports that she does not drink alcohol or use illicit drugs. Current facility-administered medications:bupivacaine (MARCAINE) 0.25 % injection, , , PRN, Marie-Lyne Jordann Grime, 10 mL at 01/10/11 1027;  ceFAZolin (ANCEF) IVPB 1 g/50 mL premix, 1 g, Intravenous, On Call to OR, ;  lactated ringers infusion, , Intravenous, Continuous, John Linward Foster., MD, Last Rate: 125 mL/hr at 01/10/11 0959 scopolamine (TRANSDERM-SCOP) 1.5 MG 1.5 mg, 1 patch, Transdermal, Once, Sandrea Hughs., MD, 1.5 mg at 01/10/11 0959 No Known Allergies    Blood pressure 144/97, pulse 78, temperature 97.5 F (36.4 C), temperature source Oral, resp. rate 18, SpO2 100.00%.   HPP: There is no problem list on file for this patient.   Prenatal labs: ABO, Rh: O/Positive/-- (05/02 0000) Antibody: Negative (05/03 0000) Rubella:  Immune RPR: NON REACTIVE (11/26 1153)  HBsAg: Negative (05/03 0000)  HIV: Non-reactive, Non-reactive (05/03 0000)  Genetic testing: Ultrascreen neg, AFP1 wnl [place] anato: wnl 1 hr GTT: 135 GBS:  Neg   Assessment/Plan: 39+ wks 2 previous C/S, macrosomia, sterilization for repeat C/S, BT/S.  Surgery and risks reviewed.   Xaine Sansom,MARIE-LYNE 01/10/2011, 11:23 AM

## 2011-01-11 LAB — CBC
MCH: 31 pg (ref 26.0–34.0)
MCV: 90.6 fL (ref 78.0–100.0)
Platelets: 187 10*3/uL (ref 150–400)
RBC: 4.03 MIL/uL (ref 3.87–5.11)
RDW: 13.7 % (ref 11.5–15.5)

## 2011-01-11 MED ORDER — OXYCODONE-ACETAMINOPHEN 5-325 MG PO TABS
1.0000 | ORAL_TABLET | ORAL | Status: DC
  Administered 2011-01-11 (×4): 2 via ORAL
  Filled 2011-01-11 (×4): qty 2

## 2011-01-11 MED ORDER — OXYCODONE-ACETAMINOPHEN 5-325 MG PO TABS
1.0000 | ORAL_TABLET | ORAL | Status: DC | PRN
  Administered 2011-01-12 (×2): 2 via ORAL
  Filled 2011-01-11 (×3): qty 2

## 2011-01-11 NOTE — Anesthesia Postprocedure Evaluation (Signed)
  Anesthesia Post-op Note  Patient: Brianna Allen  Procedure(s) Performed:  CESAREAN SECTION - Repeat with  Bilateral tubal Litgation  Patient Location: Mother/Baby  Anesthesia Type: Spinal  Level of Consciousness: awake, alert  and oriented  Airway and Oxygen Therapy: Patient Spontanous Breathing  Post-op Pain: none  Post-op Assessment: Post-op Vital signs reviewed and Patient's Cardiovascular Status Stable  Post-op Vital Signs: Reviewed and stable  Complications: No apparent anesthesia complications

## 2011-01-11 NOTE — Addendum Note (Signed)
Addendum  created 01/11/11 0743 by Madison Hickman   Modules edited:Notes Section

## 2011-01-11 NOTE — Progress Notes (Signed)
Subjective: POD# 1 Information for the patient's newborn:  Brianna Allen, Brianna Allen [[id number]]  female   Reports feeling sore but improving after percocet 2 tabs 2 hours ago.  Feeding: bottle Patient reports tolerating PO.  Breast symptoms:none Pain moderately controlled withprescription NSAID's including motrin and narcotic analgesics including percocet Denies HA/SOB/C/P/N/V/dizziness. Flatus absent. She reports vaginal bleeding as normal, without clots.  She is ambulated x 1, urinating without difficult.     Objective:   VS:  Filed Vitals:   01/10/11 2300 01/11/11 0000 01/11/11 0200 01/11/11 0600  BP:  113/76 116/69 118/79  Pulse:  61 62 75  Temp:  97.9 F (36.6 C) 97.9 F (36.6 C) 98.6 F (37 C)  TempSrc:  Oral Oral Oral  Resp:  18 18 18   SpO2: 96% 98% 96% 97%     Intake/Output Summary (Last 24 hours) at 01/11/11 0827 Last data filed at 01/11/11 0758  Gross per 24 hour  Intake   4600 ml  Output   3600 ml  Net   1000 ml        Basename 01/11/11 0533  WBC 19.1*  HGB 12.5  HCT 36.5  PLT 187     Blood type: --/--/O POS (12/05 0948)  Rubella: Immune (05/02 0000)     Physical Exam:  General: alert, cooperative and no distress CV: Regular rate and rhythm Resp: clear Abdomen: soft, nontender, decreased bowel sounds, no distension  Incision: clean, dry, intact and dressing in place Uterine Fundus: firm, below umbilicus, appropriately tender Lochia: minimal Ext: Homans sign is negative, no sign of DVT and trace edema      Assessment/Plan: 40 y.o.  status post Cesarean section. POD# 1.  s/p Cesarean Delivery.  Indications: repeat elective                Active Problems:  Postpartum care following cesarean delivery with BTL (12/5)  Doing well, stable.               Advance diet as tolerated Ambulate, warm PO fluids Routine post-op care  Atiyah Bauer 01/11/2011, 8:27 AM

## 2011-01-12 MED ORDER — IBUPROFEN 600 MG PO TABS: 600.0000 mg | ORAL_TABLET | Freq: Four times a day (QID) | ORAL | Status: AC

## 2011-01-12 MED ORDER — HYDROCODONE-ACETAMINOPHEN 5-325 MG PO TABS: 1.0000 | ORAL_TABLET | ORAL | Status: AC | PRN

## 2011-01-12 MED ORDER — HYDROCODONE-ACETAMINOPHEN 5-325 MG PO TABS
1.0000 | ORAL_TABLET | ORAL | Status: DC | PRN
  Administered 2011-01-12 (×2): 2 via ORAL
  Filled 2011-01-12 (×2): qty 2

## 2011-01-12 NOTE — Discharge Summary (Signed)
Obstetric Discharge Summary Reason for Admission: Planned repeat c/s and BTL/macrosomia Prenatal Procedures: NST and ultrasound Intrapartum Procedures: cesarean: low cervical, transverse Postpartum Procedures: none Complications-Operative and Postpartum: none Hemoglobin  Date Value Range Status  01/11/2011 12.5  12.0-15.0 (g/dL) Final     HCT  Date Value Range Status  01/11/2011 36.5  36.0-46.0 (%) Final    Discharge Diagnoses: Term Pregnancy-delivered  Discharge Information: Date: 01/12/2011 Activity: pelvic rest Diet: routine Medications: Ibuprofen Condition: stable Instructions: refer to practice specific booklet Discharge to: home  Staple removal at [facility] on Tuesday, 01/16/11 @ 245pm   Newborn Data: Live born female on 01/10/11 Birth Weight: 9 lb 6 oz (4252 g) APGAR: 9, 9  Home with mother.  Brianna Allen 01/12/2011, 11:05 AM

## 2011-01-12 NOTE — Progress Notes (Signed)
Patient ID: Brianna Allen, female   DOB: Jul 19, 1970, 40 y.o.   MRN: [id number]  POD # 2  Subjective: Pt reports feeling in pain, but would really like early d/c home/ Pain is not controlled with prescription NSAID's including motrin and narcotic analgesics including percocet/ significant pain when attempting to be OOB and amb to BR Tolerating po/Voiding without problems/ No n/v/Flatus pos Activity: out of bed and ambulate Bleeding is spotting Newborn info:  Information for the patient's newborn:  Tiffney, Haughton [[id number]]  female Feeding: bottle   Objective: VS: Blood pressure 130/74, pulse 63, temperature 97.6 F (36.4 C), temperature source Oral, resp. rate 18    Physical Exam:  General: alert, cooperative and mild distress CV: Regular rate and rhythm Resp: clear Abdomen: soft; pos BS x4 quads Uterine Fundus: firm, below umbilicus, tender Perineum: not inspected Lochia: minimal Ext: edema trace and Homans sign is negative, no sign of DVT    A/P: POD # 2/ [id number] S/P repeat c/section Poor pain control, will try vicodin. Plan d/c home later today if pain improves Continue routine post op orders Will remove staples day 7 post op in office  Signed: Arlana Lindau, [facility] 01/12/11 1100

## 2011-01-12 NOTE — Progress Notes (Signed)
Patient currently lying in bed, still using her K-pad.  Stood at the bedside to offer patient emotional support.  Patient emotionally upset about not receiving any additional orders for pain medication.  Educated about non-pharmacological methods for pain management.  Will continue to monitor.  Pt does not display any signs that she may be in pain, currently resting in bed.  Pain is rated at a "3".

## 2011-01-12 NOTE — Progress Notes (Signed)
Patient requests to receive pain medication every four hours to keep her pain under control.   Pt verbalizes that she gets relief from taking percocet but it is not the best at managing her pain.  She also verbalizes that her pain is only at it's worse with activity, such as going to the bathroom.  Pt is currently lying in bed, was previously asleep.  Pain level is currently at a "3".  From taking percocet she has currently reached a limit of 3.9g of tylenol (4000 limit in 24 hours).  If she was to receive percocet, she would be over the limit.  MD on call notified of patients request for pain medicine, verbalization of pain control from percocet and limits of percocet already reached.  Per MD, no new orders for pain medicine will be provided for the pt.

## 2011-01-15 ENCOUNTER — Encounter (HOSPITAL_COMMUNITY): Payer: Self-pay | Admitting: Obstetrics & Gynecology

## 2013-12-07 ENCOUNTER — Encounter (HOSPITAL_COMMUNITY): Payer: Self-pay | Admitting: Obstetrics & Gynecology

## 2013-12-17 ENCOUNTER — Encounter: Payer: Self-pay | Admitting: Sports Medicine

## 2013-12-17 ENCOUNTER — Ambulatory Visit (INDEPENDENT_AMBULATORY_CARE_PROVIDER_SITE_OTHER): Payer: Managed Care, Other (non HMO) | Admitting: Sports Medicine

## 2013-12-17 VITALS — BP 142/87 | HR 76 | Ht 69.0 in | Wt 205.0 lb

## 2013-12-17 DIAGNOSIS — M722 Plantar fascial fibromatosis: Secondary | ICD-10-CM

## 2013-12-17 DIAGNOSIS — R269 Unspecified abnormalities of gait and mobility: Secondary | ICD-10-CM

## 2013-12-17 MED ORDER — AMITRIPTYLINE HCL 25 MG PO TABS
25.0000 mg | ORAL_TABLET | Freq: Every day | ORAL | Status: DC
Start: 2013-12-17 — End: 2014-02-02

## 2013-12-17 MED ORDER — TRAMADOL HCL 50 MG PO TABS: 50.0000 mg | ORAL_TABLET | Freq: Four times a day (QID) | ORAL | Status: DC | PRN

## 2013-12-17 NOTE — Progress Notes (Addendum)
Verlin GrillsKelsey H Rao - 43 y.o. female MRN [id number]  Date of birth: 02/23/1970  CC & HPI:  Pt presents as new pt for evaluation of: Bilateral Plantar Foot Pain: patient reports approximately 18 months of bilateral plantar heel pain that has progressed to the point now that is interfering with her quality of life and she is unable to do many activities she enjoys. Pain is focally located over the plantar aspect but can radiate up the posterior aspect of her leg.  She has been seen by 2 podiatrist, chiropractor, physical therapist. She has been tried in orthotics, night sweats, injections, therapeutic stretching. None of these things have given her any lasting benefit. she's also tried multiple different types of shoes without any lasting relief.  She denies any significant forefoot, knee or hip pain but does have occasional non-specific low back aches without radicular sx or noted motor weakness. Her symptoms began shortly after delivery of her last child following a planned repeat C-section she reports she did not bounce back as she previously did believes that her gait may have changed because of this.  Into to have debilitating this is been to her she has been unable to exercise and reports a significant weight gain since the delivery.  She is taking approximately 800 mg of ibuprofen 3-4 times per day without improvement. She has recently had her labs checked report these overall being reassuring including evaluation for rheumatologic issues does run in her family.  ROS:  Per HPI.   HISTORY: Past Medical, Surgical, Social, and Family History Reviewed & Updated per EMR.  Pertinent Historical Findings include: Hx of anxiety and panic attacks, otherwise relatively healthy, only over-the-counter medications 3 prior C-sections and bilateral tubal ligation 01/10/2011  Nonsmoker 3 children at home, she is married.  OBJECTIVE:  VS:   HT:5\' 9"  (175.3 cm)   WT:205 lb (92.987 kg)  BMI:30.3          BP:(!)  142/87 mmHg  HR:76bpm  TEMP: ( )  RESP:   PHYSICAL EXAM: GENERAL: Adult obese Caucasian  female. mild emotional discomfort, crying intermittently throughout interview and exam due to the distress this is causing her life; no respiratory distress   PSYCH: alert and appropriate, good insight   NEURO: sensation is intact to light touch in bilateral lower extremities, lower extremity DTRs 3-/4 in B patellar and right achilles; 2/4 in left achilles; no clonus Hamstring and calf tightness with bilateral straight leg raises  VASCULAR: DP and PT pulses 2+/4.  No significant edema.   BILATERAL FOOT EXAM: Appearance: Overall normal appearing, no significant deformity Longitudinal Arch: moderate bilaterally and maintain well with weightbearing Transverse Arch: early transverse arch breakdown Calcaneous position with weight bearing: valgus right greater than left  Skin: No overlying erythema/ecchymosis.  Palpation: TTP over: lateral PF insertion and calcaneus, mildly over peroneal's and posterior tibialis  No TTP over: Achilles or retrocalcaneal fat pad Metatarsal Squeeze Test: negative  Strength & ROM: 5/5 Strength and full active ROM in: dorsiflexion, plantar flexion and inversion and eversion 4 out of 5 hip abduction strength bilaterally with TFL predominant recruitment  Special Tests: Repeat Heel Raise: appropriate posterior tibialis recruitment however he fatigues early and painful      Walking Gait & Functional Exam:  Leg Length: Neutral, ASIS compression test equal bilaterally  General: antalgic with bilateral slight Trendelenburg  Strike/foot: Lateral foot strike  Other: Excessive pronation       Limited MSK Ultrasound of Bilateral Heels: Findings: Marked thickening of the  insertion of the plantar fascia approximately 0.77 cm with significant hypoechoic change on the right Thickened insertion of the plantar fascia approximately 0.5 cm on the left with hypoechoic change less pronounced  than on the right. Achilles tendon, posterior tibialis and peroneal tendons unremarkable  Impression: The above findings are consistent with bilateral (right greater than left) plantar fasciitis    ASSESSMENT: 1. Bilateral plantar fasciitis   2. Abnormality of gait    - recalcitrant PF likely due to gait changes associated with hip abduction weakness.  No evidence of radicular component but suspect some possible s1 Nerve root irritation contributing to magnification of symptoms  PLAN: See problem based charting & AVS for additional documentation. - Long discussion to day regarding evidence based approach to PF.   - Evidence based TherEx reviewed and demonstrated today: Eccentric Heel drops, PF Stretch and toe walking - Hip Abduction exercises to improve gait.   - Rx for Elavil qhs and Ultram to use PRN; goal to wean off NSAIDs - Bilateral arch straps  > Pt did not bring regular shoes with her today but was provided with medial heel wedges to use in shoes she is currently wearing.  Will return with regular shoes to have small scaphoid pads and medial heel wedges added.   > consider cushioned custom orthotics as pt reports not tolerating hard soled orthotics from podiatrist > Return in about 6 weeks (around 01/28/2014).

## 2013-12-17 NOTE — Patient Instructions (Signed)
It was nice to meet you.  Your home work:  1. Plantar fascia stretching plus twist 2. Heel raises and stretching.  Up fast and down slowly over 5 seconds. Work up to 3 sets of 30 per day 3. Forward and backward toe walking 4. Hip abduction leg lifts (laying on side; should feel in glutes).  Work up up 3 sets of 30 per day

## 2013-12-18 DIAGNOSIS — R269 Unspecified abnormalities of gait and mobility: Secondary | ICD-10-CM | POA: Insufficient documentation

## 2013-12-18 DIAGNOSIS — M722 Plantar fascial fibromatosis: Secondary | ICD-10-CM | POA: Insufficient documentation

## 2014-01-21 ENCOUNTER — Ambulatory Visit: Payer: Managed Care, Other (non HMO) | Admitting: Sports Medicine

## 2014-02-02 ENCOUNTER — Other Ambulatory Visit: Payer: Self-pay | Admitting: Sports Medicine

## 2014-02-07 NOTE — Telephone Encounter (Signed)
Will forward this to Dr. Berline Chough.

## 2014-03-04 ENCOUNTER — Encounter: Payer: Self-pay | Admitting: Sports Medicine

## 2014-03-04 ENCOUNTER — Ambulatory Visit (INDEPENDENT_AMBULATORY_CARE_PROVIDER_SITE_OTHER): Payer: Managed Care, Other (non HMO) | Admitting: Sports Medicine

## 2014-03-04 VITALS — BP 124/88 | Ht 69.0 in | Wt 205.0 lb

## 2014-03-04 DIAGNOSIS — M722 Plantar fascial fibromatosis: Secondary | ICD-10-CM

## 2014-03-04 DIAGNOSIS — R269 Unspecified abnormalities of gait and mobility: Secondary | ICD-10-CM

## 2014-03-04 MED ORDER — AMITRIPTYLINE HCL 50 MG PO TABS: 50.0000 mg | ORAL_TABLET | Freq: Every day | ORAL | Status: DC

## 2014-03-04 NOTE — Patient Instructions (Signed)
It was nice to meet you today. Please follow-up as scheduled. Start taking her new medication.

## 2014-03-04 NOTE — Progress Notes (Signed)
  Brianna Allen - 44 y.o. female MRN [id number]  Date of birth: 08/10/1970  SUBJECTIVE: CC: Follow-up bilateral foot pain HPI: Patient reports overall some improvement since last visit. She is having better days then she was previously. She is tolerating the amitriptyline well and is in trusted and increasing her dose. Continues to have significant plantar heel pain that is nonradiating and described as a burning. She is performing heel raises but has not performing negative drop aspects on a regular basis. She is attached and resuming walking. Using tramadol only as needed.  ROS: otherwise per HPI.   HISTORY:  Past Medical, Surgical, Social, and Family History reviewed & updated per EMR.  Pertinent Historical Findings include:  reports that she has never smoked. She does not have any smokeless tobacco history on file. History of anxiety attacks and depression Significant weight gain with her last pregnancy Otherwise relatively healthy.  Nonsmoker, 3 young children at home. Works as a Environmental managerphotographer.  OBJECTIVE:  VS:   HT:5\' 9"  (175.3 cm)   WT:205 lb (92.987 kg)  BMI:30.3          BP:124/88 mmHg  HR: bpm  TEMP: ( )  RESP:   PHYSICAL EXAM:  GENERAL: Adult obese Caucasian  female.  no respiratory distress   PSYCH: alert and appropriate, good insight   NEURO: sensation is intact to light touch in bilateral lower extremities, lower extremity  Hamstring and calf tightness with bilateral straight leg raises  VASCULAR: DP and PT pulses 2+/4.  No significant edema.   BILATERAL FOOT EXAM: Appearance: Overall normal appearing, no significant deformity Longitudinal Arch: moderate bilaterally and maintain well with weightbearing Transverse Arch: early transverse arch breakdown Calcaneous position with weight bearing: valgus right greater than left  Skin: No overlying erythema/ecchymosis.  Palpation: TTP over: Bilateral PF insertion and calcaneus,   No TTP over: Achilles or retrocalcaneal  fat pad Metatarsal Squeeze Test: negative  Strength & ROM: 5/5 Strength and full active ROM in: dorsiflexion, plantar flexion and inversion and eversion Dorsiflexion with knee straight: Right to 95, left 90   Special Tests: Repeat Heel Raise: appropriate posterior tibialis recruitment however he fatigues early and painful       Limited MSK Ultrasound of Bilateral Heels: Findings: Marked thickening of the insertion of the plantar fascia bilaterally. Right proximally 0.76 cm with hypoechoic change that is more isoechoic did previous. Left 0.5 cm. Achilles tendon, posterior tibialis and peroneal tendons unremarkable  Impression: The above findings are consistent with bilateral (right greater than left) plantar fasciitis    ASSESSMENT: 1. Bilateral plantar fasciitis   2. Abnormality of gait    - recalcitrant PF improving. patient is noticing some improvement and is wishing for faster recovery but happy with her progress thus far. - Tolerating Elavil therapy. PLAN: See problem based charting & AVS for additional documentation.  - Evidence based TherEx reviewed and read demonstrated today. Focus on eccentric dorsiflexion with knee straight.  - Rx titrate Elavil to 50. Continue Ultram when necessary. Continue to  wean off NSAIDs - Continue Bilateral arch straps - Patient shoes have been modified with the medial heel wedge and she is tolerating this well. Could consider custom orthotics in future. Also consider full sleeve ankle body helix if plateau occurs. > Return in about 6 weeks (around 04/15/2014).

## 2014-04-22 ENCOUNTER — Other Ambulatory Visit: Payer: Self-pay | Admitting: *Deleted

## 2014-04-22 ENCOUNTER — Ambulatory Visit: Payer: Managed Care, Other (non HMO) | Admitting: Sports Medicine

## 2014-04-22 DIAGNOSIS — R269 Unspecified abnormalities of gait and mobility: Secondary | ICD-10-CM

## 2014-04-22 DIAGNOSIS — M722 Plantar fascial fibromatosis: Secondary | ICD-10-CM

## 2014-04-22 MED ORDER — AMITRIPTYLINE HCL 50 MG PO TABS: 50.0000 mg | ORAL_TABLET | Freq: Every day | ORAL | Status: DC

## 2014-05-20 ENCOUNTER — Ambulatory Visit (INDEPENDENT_AMBULATORY_CARE_PROVIDER_SITE_OTHER): Payer: Managed Care, Other (non HMO) | Admitting: Sports Medicine

## 2014-05-20 ENCOUNTER — Encounter: Payer: Self-pay | Admitting: Sports Medicine

## 2014-05-20 VITALS — BP 127/85 | HR 91 | Ht 69.0 in | Wt 205.0 lb

## 2014-05-20 DIAGNOSIS — M722 Plantar fascial fibromatosis: Secondary | ICD-10-CM

## 2014-05-20 DIAGNOSIS — R269 Unspecified abnormalities of gait and mobility: Secondary | ICD-10-CM

## 2014-05-20 MED ORDER — AMITRIPTYLINE HCL 50 MG PO TABS: 50.0000 mg | ORAL_TABLET | Freq: Every day | ORAL | Status: DC

## 2014-05-20 MED ORDER — TRAMADOL HCL 50 MG PO TABS: 50.0000 mg | ORAL_TABLET | Freq: Four times a day (QID) | ORAL | Status: DC | PRN

## 2014-05-20 NOTE — Progress Notes (Signed)
Patient ID: [name], female   DOB: 09/08/1970, 44 y.o.   MRN: [id number]  Patient enters in followup of plantar fasciitis which she has had for over 2 years She was sent by herb Bolick  Since her last visit she's made fairly dramatic progress She rated her pain prior to that time as an 8 Now she says most times is a 4 and sometimes below that  [place] Over 3-4 days she walk more than 10 miles a day  She continues with insoles with heel wedges and moderate arch support She uses arch straps  She has resumed more exercise and has lost 10 pounds  Examination Mildly obese female in no acute distress with a good affect BP 127/85 mmHg  Pulse 91  Ht 5\' 9"  (1.753 m)  Wt 205 lb (92.987 kg)  BMI 30.26 kg/m2  Palpation of both feet reveals tenderness at the medial aspect of the right plantar fascial insertion Only slight tenderness on the left plantar fascial insertion  Walking gait is now free of a limp both in shoes and barefoot  She can walk on toes and has improved calf strength

## 2014-05-20 NOTE — Assessment & Plan Note (Signed)
Much improved to where the patient feels that she is more her normal self  She no longer wakes up at night or feels depressed  Amitriptyline 50 mg at night did help  Continue this  Continue the inserts and today I added arch pads which felt even better  Continue arch straps  Continue home exercises and stretches  Recheck 3 months

## 2015-04-14 ENCOUNTER — Other Ambulatory Visit: Payer: Self-pay | Admitting: Obstetrics & Gynecology

## 2015-04-14 DIAGNOSIS — R928 Other abnormal and inconclusive findings on diagnostic imaging of breast: Secondary | ICD-10-CM

## 2015-04-20 ENCOUNTER — Ambulatory Visit
Admission: RE | Admit: 2015-04-20 | Discharge: 2015-04-20 | Disposition: A | Discharge status: Home / Self Care | Payer: Managed Care, Other (non HMO) | Source: Ambulatory Visit | Attending: Obstetrics & Gynecology | Admitting: Obstetrics & Gynecology

## 2015-04-20 DIAGNOSIS — R928 Other abnormal and inconclusive findings on diagnostic imaging of breast: Secondary | ICD-10-CM

## 2015-05-27 ENCOUNTER — Encounter (HOSPITAL_COMMUNITY): Payer: Self-pay | Admitting: *Deleted

## 2015-06-02 ENCOUNTER — Other Ambulatory Visit: Payer: Self-pay | Admitting: Obstetrics & Gynecology

## 2015-06-17 ENCOUNTER — Encounter (HOSPITAL_COMMUNITY)
Admission: RE | Disposition: A | Discharge status: Home / Self Care | Payer: Self-pay | Source: Ambulatory Visit | Attending: Obstetrics & Gynecology

## 2015-06-17 ENCOUNTER — Ambulatory Visit (HOSPITAL_COMMUNITY): Payer: Managed Care, Other (non HMO) | Admitting: Certified Registered Nurse Anesthetist

## 2015-06-17 ENCOUNTER — Ambulatory Visit (HOSPITAL_COMMUNITY)
Admission: RE | Admit: 2015-06-17 | Discharge: 2015-06-17 | Disposition: A | Discharge status: Home / Self Care | Payer: Managed Care, Other (non HMO) | Source: Ambulatory Visit | Attending: Obstetrics & Gynecology | Admitting: Obstetrics & Gynecology

## 2015-06-17 ENCOUNTER — Encounter (HOSPITAL_COMMUNITY): Payer: Self-pay | Admitting: *Deleted

## 2015-06-17 DIAGNOSIS — N920 Excessive and frequent menstruation with regular cycle: Secondary | ICD-10-CM | POA: Insufficient documentation

## 2015-06-17 DIAGNOSIS — M1990 Unspecified osteoarthritis, unspecified site: Secondary | ICD-10-CM | POA: Insufficient documentation

## 2015-06-17 DIAGNOSIS — F419 Anxiety disorder, unspecified: Secondary | ICD-10-CM | POA: Diagnosis not present

## 2015-06-17 HISTORY — PX: DILITATION & CURRETTAGE/HYSTROSCOPY WITH NOVASURE ABLATION: SHX5568

## 2015-06-17 HISTORY — DX: Plantar fascial fibromatosis: M72.2

## 2015-06-17 LAB — CBC
HEMATOCRIT: 34.6 % — AB (ref 36.0–46.0)
HEMOGLOBIN: 11.2 g/dL — AB (ref 12.0–15.0)
MCH: 27.8 pg (ref 26.0–34.0)
MCHC: 32.4 g/dL (ref 30.0–36.0)
MCV: 85.9 fL (ref 78.0–100.0)
Platelets: 190 10*3/uL (ref 150–400)
RBC: 4.03 MIL/uL (ref 3.87–5.11)
RDW: 13.9 % (ref 11.5–15.5)
WBC: 11 10*3/uL — AB (ref 4.0–10.5)

## 2015-06-17 LAB — PREGNANCY, URINE: Preg Test, Ur: NEGATIVE

## 2015-06-17 SURGERY — DILATATION & CURETTAGE/HYSTEROSCOPY WITH NOVASURE ABLATION
Anesthesia: General

## 2015-06-17 MED ORDER — ONDANSETRON HCL 4 MG/2ML IJ SOLN
INTRAMUSCULAR | Status: AC
  Filled 2015-06-17: qty 2

## 2015-06-17 MED ORDER — ONDANSETRON HCL 4 MG/2ML IJ SOLN: 4.0000 mg | Freq: Once | INTRAMUSCULAR | Status: DC | PRN

## 2015-06-17 MED ORDER — KETOROLAC TROMETHAMINE 30 MG/ML IJ SOLN
INTRAMUSCULAR | Status: AC
  Filled 2015-06-17: qty 1

## 2015-06-17 MED ORDER — ONDANSETRON HCL 4 MG/2ML IJ SOLN
INTRAMUSCULAR | Status: DC | PRN
  Administered 2015-06-17: 4 mg via INTRAVENOUS

## 2015-06-17 MED ORDER — KETOROLAC TROMETHAMINE 30 MG/ML IJ SOLN
INTRAMUSCULAR | Status: DC | PRN
  Administered 2015-06-17: 30 mg via INTRAVENOUS

## 2015-06-17 MED ORDER — SCOPOLAMINE 1 MG/3DAYS TD PT72
1.0000 | MEDICATED_PATCH | Freq: Once | TRANSDERMAL | Status: DC
  Administered 2015-06-17: 1.5 mg via TRANSDERMAL

## 2015-06-17 MED ORDER — IBUPROFEN 200 MG PO TABS
200.0000 mg | ORAL_TABLET | Freq: Four times a day (QID) | ORAL | Status: DC | PRN
  Filled 2015-06-17: qty 2

## 2015-06-17 MED ORDER — KETOROLAC TROMETHAMINE 30 MG/ML IJ SOLN: 30.0000 mg | Freq: Once | INTRAMUSCULAR | Status: DC

## 2015-06-17 MED ORDER — MIDAZOLAM HCL 2 MG/2ML IJ SOLN
INTRAMUSCULAR | Status: AC
  Filled 2015-06-17: qty 2

## 2015-06-17 MED ORDER — CEFAZOLIN SODIUM-DEXTROSE 2-4 GM/100ML-% IV SOLN
2.0000 g | INTRAVENOUS | Status: AC
  Administered 2015-06-17: 2 g via INTRAVENOUS

## 2015-06-17 MED ORDER — PROPOFOL 10 MG/ML IV BOLUS
INTRAVENOUS | Status: AC
  Filled 2015-06-17: qty 20

## 2015-06-17 MED ORDER — PROPOFOL 10 MG/ML IV BOLUS
INTRAVENOUS | Status: DC | PRN
  Administered 2015-06-17: 100 mg via INTRAVENOUS
  Administered 2015-06-17: 200 mg via INTRAVENOUS

## 2015-06-17 MED ORDER — LIDOCAINE HCL (CARDIAC) 20 MG/ML IV SOLN
INTRAVENOUS | Status: DC | PRN
  Administered 2015-06-17: 50 mg via INTRAVENOUS

## 2015-06-17 MED ORDER — SCOPOLAMINE 1 MG/3DAYS TD PT72
MEDICATED_PATCH | TRANSDERMAL | Status: AC
  Administered 2015-06-17: 1.5 mg via TRANSDERMAL
  Filled 2015-06-17: qty 1

## 2015-06-17 MED ORDER — MIDAZOLAM HCL 2 MG/2ML IJ SOLN
INTRAMUSCULAR | Status: DC | PRN
  Administered 2015-06-17: 2 mg via INTRAVENOUS

## 2015-06-17 MED ORDER — DEXAMETHASONE SODIUM PHOSPHATE 4 MG/ML IJ SOLN
INTRAMUSCULAR | Status: AC
  Filled 2015-06-17: qty 1

## 2015-06-17 MED ORDER — HYDROCODONE-ACETAMINOPHEN 7.5-325 MG PO TABS
ORAL_TABLET | ORAL | Status: AC
  Filled 2015-06-17: qty 1

## 2015-06-17 MED ORDER — FENTANYL CITRATE (PF) 100 MCG/2ML IJ SOLN
INTRAMUSCULAR | Status: DC
Start: 2015-06-17 — End: 2015-06-17
  Filled 2015-06-17: qty 2

## 2015-06-17 MED ORDER — LIDOCAINE HCL (CARDIAC) 20 MG/ML IV SOLN
INTRAVENOUS | Status: AC
  Filled 2015-06-17: qty 5

## 2015-06-17 MED ORDER — LACTATED RINGERS IV SOLN
INTRAVENOUS | Status: DC
  Administered 2015-06-17 (×2): via INTRAVENOUS

## 2015-06-17 MED ORDER — CHLOROPROCAINE HCL 1 % IJ SOLN
INTRAMUSCULAR | Status: AC
  Filled 2015-06-17: qty 30

## 2015-06-17 MED ORDER — HYDROCODONE-ACETAMINOPHEN 7.5-325 MG PO TABS
1.0000 | ORAL_TABLET | Freq: Once | ORAL | Status: AC | PRN
  Administered 2015-06-17: 1 via ORAL

## 2015-06-17 MED ORDER — IBUPROFEN 100 MG/5ML PO SUSP
200.0000 mg | Freq: Four times a day (QID) | ORAL | Status: DC | PRN
  Filled 2015-06-17: qty 20

## 2015-06-17 MED ORDER — FENTANYL CITRATE (PF) 100 MCG/2ML IJ SOLN
INTRAMUSCULAR | Status: DC | PRN
  Administered 2015-06-17 (×2): 50 ug via INTRAVENOUS

## 2015-06-17 MED ORDER — CEFAZOLIN SODIUM-DEXTROSE 2-3 GM-% IV SOLR
INTRAVENOUS | Status: AC
  Filled 2015-06-17: qty 50

## 2015-06-17 MED ORDER — MEPERIDINE HCL 25 MG/ML IJ SOLN: 6.2500 mg | INTRAMUSCULAR | Status: DC | PRN

## 2015-06-17 MED ORDER — FENTANYL CITRATE (PF) 100 MCG/2ML IJ SOLN
25.0000 ug | INTRAMUSCULAR | Status: DC | PRN
Start: 2015-06-17 — End: 2015-06-17
  Administered 2015-06-17: 50 ug via INTRAVENOUS
  Administered 2015-06-17 (×2): 25 ug via INTRAVENOUS

## 2015-06-17 MED ORDER — DEXAMETHASONE SODIUM PHOSPHATE 10 MG/ML IJ SOLN
INTRAMUSCULAR | Status: DC | PRN
  Administered 2015-06-17: 4 mg via INTRAVENOUS

## 2015-06-17 MED ORDER — OXYCODONE-ACETAMINOPHEN 7.5-325 MG PO TABS: 1.0000 | ORAL_TABLET | ORAL | Status: DC | PRN

## 2015-06-17 MED ORDER — LACTATED RINGERS IV SOLN
INTRAVENOUS | Status: DC | PRN
  Administered 2015-06-17: 1000 mL via INTRAVENOUS

## 2015-06-17 MED ORDER — FENTANYL CITRATE (PF) 100 MCG/2ML IJ SOLN
INTRAMUSCULAR | Status: AC
  Filled 2015-06-17: qty 2

## 2015-06-17 MED ORDER — CHLOROPROCAINE HCL 1 % IJ SOLN
INTRAMUSCULAR | Status: DC | PRN
  Administered 2015-06-17: 20 mL

## 2015-06-17 SURGICAL SUPPLY — 15 items
ABLATOR ENDOMETRIAL BIPOLAR (ABLATOR) ×3 IMPLANT
CATH ROBINSON RED A/P 16FR (CATHETERS) ×3 IMPLANT
CLOTH BEACON ORANGE TIMEOUT ST (SAFETY) ×3 IMPLANT
CONTAINER PREFILL 10% NBF 60ML (FORM) IMPLANT
GLOVE BIO SURGEON STRL SZ 6.5 (GLOVE) ×2 IMPLANT
GLOVE BIO SURGEONS STRL SZ 6.5 (GLOVE) ×1
GLOVE BIOGEL PI IND STRL 7.0 (GLOVE) ×3 IMPLANT
GLOVE BIOGEL PI INDICATOR 7.0 (GLOVE) ×6
GOWN STRL REUS W/TWL LRG LVL3 (GOWN DISPOSABLE) ×6 IMPLANT
PACK VAGINAL MINOR WOMEN LF (CUSTOM PROCEDURE TRAY) ×3 IMPLANT
PAD OB MATERNITY 4.3X12.25 (PERSONAL CARE ITEMS) ×3 IMPLANT
TOWEL OR 17X24 6PK STRL BLUE (TOWEL DISPOSABLE) ×6 IMPLANT
TUBING AQUILEX INFLOW (TUBING) ×3 IMPLANT
TUBING AQUILEX OUTFLOW (TUBING) ×3 IMPLANT
WATER STERILE IRR 1000ML POUR (IV SOLUTION) ×3 IMPLANT

## 2015-06-17 NOTE — Op Note (Signed)
06/17/2015  10:02 AM  PATIENT:  Brianna GrillsKelsey H Poole  45 y.o. female  PRE-OPERATIVE DIAGNOSIS:  Menorrhagia  POST-OPERATIVE DIAGNOSIS:  Menorrhagia  PROCEDURE:  Procedure(s): DILATATION & CURETTAGE/HYSTEROSCOPY WITH NOVASURE ABLATION  SURGEON:  Surgeon(s): Genia DelMarie-Lyne Ravis Herne, MD  ASSISTANTS: none   ANESTHESIA:   general   PROCEDURE:  Under general anesthesia with laryngeal mask the patient is an lithotomy position. She was prepped with Betadine and a suprapubic, vulvar and vaginal areas. She has draped as usual. The vaginal exam reveals an anteverted uterus with normal volume and no adnexal masses.  The speculum is inserted in the vagina and the anterior lip of the cervix is grasped with a tenaculum. A paracervical block is done with Nesacaine 1% a total of 20 cc at 4 and 8:00.  The cervical length is at 4 cm and the hysterometry is at 10 cm for a cavity length of 6 cm.  Dilation of the cervix with Hegar dilators up to #27 without difficulty.  The hysteroscope was inserted in the intrauterine cavity and inspection is done. Both ostia are well visualized and no lesion is present in the intrauterine cavity.  Pictures are taken.  The hysteroscope was removed. A systematic curettage of the intrauterine cavity is done with a sharp curet on all surfaces. The specimen is sent to pathology.  We then inserted the NovaSure endometrial ablation system in the intrauterine cavity.  The width is measured at 4.5 cm.  The security test is passed.  We proceed with the ablation procedure at a power of 149 watts during 1 minute.  The NovaSure ablation system is then removed from the intrauterine cavity.  The tenaculum is removed from the cervix.  The tenaculum caused a small tear at the anterior aspect of the cervix which is repaired with a locked running suture of Vicryl 0.  Hemostasis is adequate.  The speculum is removed.  The patient is brought to recovery room in good and stable status.  ESTIMATED BLOOD LOSS: 10  cc FLUID DEFICIT:  20 cc   Intake/Output Summary (Last 24 hours) at 06/17/15 1002 Last data filed at 06/17/15 0942  Gross per 24 hour  Intake   1300 ml  Output    210 ml  Net   1090 ml     BLOOD ADMINISTERED:none   LOCAL MEDICATIONS USED:  20 cc of Nesacaine 1% for Paracervical block  SPECIMEN:  Source of Specimen:  Endometrial curettings  DISPOSITION OF SPECIMEN:  PATHOLOGY  COUNTS:  YES  PLAN OF CARE: Transfer to PACU  Genia DelMarie-Lyne Miamarie Moll MD  06/17/2015 at 10:03 am

## 2015-06-17 NOTE — Discharge Instructions (Addendum)
Endometrial Ablation °Endometrial ablation removes the lining of the uterus (endometrium). It is usually a same-day, outpatient treatment. Ablation helps avoid major surgery, such as surgery to remove the cervix and uterus (hysterectomy). After endometrial ablation, you will have little or no menstrual bleeding and may not be able to have children. However, if you are premenopausal, you will need to use a reliable method of birth control following the procedure because of the small chance that pregnancy can occur. °There are different reasons to have this procedure. These reasons include: °· Heavy periods. °· Bleeding that is causing anemia. °· Irregular bleeding. °· Bleeding fibroids on the lining inside the uterus if they are smaller than 3 centimeters. °This procedure may not be possible for you if:  °· You want to have children in the future.   °· You have severe cramps with your menstrual period.   °· You have precancerous or cancerous cells in your uterus.   °· You were recently pregnant.   °· You have gone through menopause.   °· You have had major surgery on your uterus, resulting in thinning of the uterine wall. Surgeries may include: °¨ The removal of one or more uterine fibroids (myomectomy). °¨ A cesarean section with a classic (vertical) incision on your uterus. Ask your health care provider what type of cesarean you had. Sometimes the scar on your skin is different than the scar on your uterus. °Even if you have had surgery on your uterus, certain types of ablation may still be safe for you. Talk with your health care provider. °LET YOUR HEALTH CARE PROVIDER KNOW ABOUT: °· Any allergies you have. °· All medicines you are taking, including vitamins, herbs, eye drops, creams, and over-the-counter medicines. °· Previous problems you or members of your family have had with the use of anesthetics. °· Any blood disorders you have. °· Previous surgeries you have had. °· Medical conditions you have. °RISKS AND  COMPLICATIONS  °Generally, this is a safe procedure. However, as with any procedure, complications can occur. Possible complications include: °· Perforation of the uterus. °· Bleeding. °· Infection of the uterus, bladder, or vagina. °· Injury to surrounding organs. °· An air bubble to the lung (air embolus). °· Pregnancy following the procedure. °· Failure of the procedure to help the problem, requiring hysterectomy. °· Decreased ability to diagnose cancer in the lining of the uterus. °BEFORE THE PROCEDURE °· The lining of the uterus must be tested to make sure there is no pre-cancerous or cancer cells present. °· An ultrasound may be performed to look at the size of the uterus and to check for abnormalities. °· Medicines may be given to thin the lining of the uterus. °PROCEDURE  °During the procedure, your health care provider will use a tool called a resectoscope to help see inside your uterus. There are different ways to remove the lining of your uterus.  °· Radiofrequency - This method uses a radiofrequency-alternating electric current to remove the lining of the uterus. °· Cryotherapy - This method uses extreme cold to freeze the lining of the uterus. °· Heated-Free Liquid - This method uses heated salt (saline) solution to remove the lining of the uterus. °· Microwave - This method uses high-energy microwaves to heat up the lining of the uterus to remove it. °· Thermal balloon - This method involves inserting a catheter with a balloon tip into the uterus. The balloon tip is filled with heated fluid to remove the lining of the uterus. °AFTER THE PROCEDURE  °After your procedure, do   not have sexual intercourse or insert anything into your vagina until permitted by your health care provider. After the procedure, you may experience:  Cramps.  Vaginal discharge.  Frequent urination.   This information is not intended to replace advice given to you by your health care provider. Make sure you discuss any  questions you have with your health care provider.   Document Released: 12/02/2003 Document Revised: 10/13/2014 Document Reviewed: 06/25/2012 Elsevier Interactive Patient Education 2016 Else[employer].  DISCHARGE INSTRUCTIONS: HYSTEROSCOPY / ENDOMETRIAL ABLATION The following instructions have been prepared to help you care for yourself upon your return home.  May Remove Scop patch on or before 06/20/15  May take Ibuprofen after 3:45pm 06/17/15  May take stool softner while taking narcotic pain medication to prevent constipation.  Drink plenty of water.  Personal hygiene:  Use sanitary pads for vaginal drainage, not tampons.  Shower the day after your procedure.  NO tub baths, pools or Jacuzzis for 2-3 weeks.  Wipe front to back after using the bathroom.  Activity and limitations:  Do NOT drive or operate any equipment for 24 hours. The effects of anesthesia are still present and drowsiness may result.  Do NOT rest in bed all day.  Walking is encouraged.  Walk up and down stairs slowly.  You may resume your normal activity in one to two days or as indicated by your physician. Sexual activity: NO intercourse for at least 2 weeks after the procedure, or as indicated by your Doctor.  Diet: Eat a light meal as desired this evening. You may resume your usual diet tomorrow.  Return to Work: You may resume your work activities in one to two days or as indicated by Therapist, sportsyour Doctor.  What to expect after your surgery: Expect to have vaginal bleeding/discharge for 2-3 days and spotting for up to 10 days. It is not unusual to have soreness for up to 1-2 weeks. You may have a slight burning sensation when you urinate for the first day. Mild cramps may continue for a couple of days. You may have a regular period in 2-6 weeks.  Call your doctor for any of the following:  Excessive vaginal bleeding or clotting, saturating and changing one pad every hour.  Inability to urinate 6 hours  after discharge from hospital.  Pain not relieved by pain medication.  Fever of 100.4 F or greater.  Unusual vaginal discharge or odor.  Return to office _________________Call for an appointment ___________________ Patients signature: ______________________ Nurses signature ________________________  Post Anesthesia Care Unit [phone]

## 2015-06-17 NOTE — H&P (Signed)
Brianna Allen is an 45 y.o. female [phone]  RP:  [facility]agia [facility] Novasure Endometrial Ablation  Pertinent Gynecological History: Menses: flow is excessive with use of many pads or tampons on heaviest days Contraception: BT/S Blood transfusions: none Sexually transmitted diseases: no past history Previous GYN Procedures: BT/S at time of C/S  Last mammogram: normal  Last pap: normal OB History: G3P3003   Menstrual History: Patient's last menstrual period was 05/27/2015 (lmp unknown).    Past Medical History  Diagnosis Date  . Anxiety   . Panic attacks   . Arthritis     right arm-titanium elbow joint-reduced flexion  . Plantar fasciitis     Past Surgical History  Procedure Laterality Date  . Cesarean section  [id number]  . Cholecystectomy  2000  . Elbow surgery Right 97    titanium plate-does not have full extension  . Laparoscopic gastric banding  2009  . Cesarean section  01/10/2011    Procedure: CESAREAN SECTION;  Surgeon: Genia DelMarie-Lyne Maks Cavallero;  Location: [facility];  Service: Gynecology;  Laterality: N/A;  Repeat with  Bilateral tubal Litgation  . Wisdom tooth extraction    . Tonsillectomy and adenoidectomy    . Breast biopsy Left   . Tubal ligation      History reviewed. No pertinent family history.  Social History:  reports that she has never smoked. She has never used smokeless tobacco. She reports that she drinks about 3.0 oz of alcohol per week. She reports that she does not use illicit drugs.  Allergies: No Known Allergies  Prescriptions prior to admission  Medication Sig Dispense Refill Last Dose  . clonazePAM (KLONOPIN) 1 MG tablet Take 1 mg by mouth 2 (two) times daily as needed for anxiety.    06/17/2015 at 0700  . ibuprofen (ADVIL,MOTRIN) 200 MG tablet Take 400 mg by mouth every 6 (six) hours as needed for mild pain.   Past Week at Unknown time  . traMADol (ULTRAM) 50 MG tablet Take 1 tablet (50 mg total) by mouth every 6 (six) hours as needed. 40  tablet 0 Past Week at Unknown time  . amitriptyline (ELAVIL) 50 MG tablet Take 1 tablet (50 mg total) by mouth at bedtime. (Patient taking differently: Take 50 mg by mouth at bedtime as needed (For plantar fasciitis flare up.). ) 30 tablet 5     ROS Neg  Blood pressure 136/90, pulse 83, temperature 98.1 F (36.7 C), temperature source Oral, resp. rate 20, height 5\' 8"  (1.727 m), weight 215 lb (97.523 kg), last menstrual period 05/27/2015, SpO2 100 %. Physical Exam  Results for orders placed or performed during the hospital encounter of 06/17/15 (from the past 24 hour(s))  Pregnancy, urine     Status: None   Collection Time: 06/17/15  7:30 AM  Result Value Ref Range   Preg Test, Ur NEGATIVE NEGATIVE  CBC     Status: Abnormal   Collection Time: 06/17/15  7:39 AM  Result Value Ref Range   WBC 11.0 (H) 4.0 - 10.5 K/uL   RBC 4.03 3.87 - 5.11 MIL/uL   Hemoglobin 11.2 (L) 12.0 - 15.0 g/dL   HCT 14.734.6 (L) 82.936.0 - 56.246.0 %   MCV 85.9 78.0 - 100.0 fL   MCH 27.8 26.0 - 34.0 pg   MCHC 32.4 30.0 - 36.0 g/dL   RDW 13.013.9 86.511.5 - 78.415.5 %   Platelets 190 150 - 400 K/uL    Assessment/Plan: Menorrhagia for [facility] D+C, Novasure Endometrial Ablation.  Surgery and  risks reviewed.  [name],MARIE-LYNE 06/17/2015, 8:59 AM

## 2015-06-17 NOTE — Anesthesia Procedure Notes (Signed)
Procedure Name: LMA Insertion Date/Time: 06/17/2015 9:11 AM Performed by: Jhonnie GarnerMARSHALL, Cedarius Kersh M Pre-anesthesia Checklist: Patient identified, Emergency Drugs available, Suction available and Patient being monitored Patient Re-evaluated:Patient Re-evaluated prior to inductionOxygen Delivery Method: Circle system utilized Preoxygenation: Pre-oxygenation with 100% oxygen Intubation Type: IV induction Ventilation: Mask ventilation without difficulty LMA: LMA inserted LMA Size: 4.0 Number of attempts: 1 Placement Confirmation: positive ETCO2,  CO2 detector and breath sounds checked- equal and bilateral Tube secured with: Tape Dental Injury: Teeth and Oropharynx as per pre-operative assessment

## 2015-06-17 NOTE — Anesthesia Preprocedure Evaluation (Signed)
Anesthesia Evaluation  Patient identified by MRN, date of birth, ID band Patient awake    Reviewed: Allergy & Precautions, H&P , NPO status , Patient's Chart, lab work & pertinent test results  Airway Mallampati: I  TM Distance: >3 FB Neck ROM: full    Dental no notable dental hx. (+) Teeth Intact   Pulmonary neg pulmonary ROS,    Pulmonary exam normal        Cardiovascular negative cardio ROS Normal cardiovascular exam     Neuro/Psych negative neurological ROS     GI/Hepatic negative GI ROS, Neg liver ROS,   Endo/Other  negative endocrine ROS  Renal/GU negative Renal ROS     Musculoskeletal   Abdominal (+) + obese,   Peds  Hematology negative hematology ROS (+)   Anesthesia Other Findings   Reproductive/Obstetrics negative OB ROS                             Anesthesia Physical Anesthesia Plan  ASA: II  Anesthesia Plan: General   Post-op Pain Management:    Induction: Intravenous  Airway Management Planned: LMA  Additional Equipment:   Intra-op Plan:   Post-operative Plan:   Informed Consent: I have reviewed the patients History and Physical, chart, labs and discussed the procedure including the risks, benefits and alternatives for the proposed anesthesia with the patient or authorized representative who has indicated his/her understanding and acceptance.     Plan Discussed with: CRNA and Surgeon  Anesthesia Plan Comments:         Anesthesia Quick Evaluation

## 2015-06-17 NOTE — Discharge Summary (Signed)
  Physician Discharge Summary  Patient ID: Brianna Allen MRN: [id number] DOB/AGE: 43/02/1970 43 y.o.  Admit date: 06/17/2015 Discharge date: 06/17/2015  Admission Diagnoses: Menorrhagia  Discharge Diagnoses: Menorrhagia        Active Problems:   * No active hospital problems. *   Discharged Condition: good  Hospital Course: Outpatient  Consults: None  Treatments: surgery: Hysteroscopy, D+C, Novasure Endometrial Ablation  Disposition: 01-Home or Self Care     Medication List    TAKE these medications        amitriptyline 50 MG tablet  Commonly known as:  ELAVIL  Take 1 tablet (50 mg total) by mouth at bedtime.     clonazePAM 1 MG tablet  Commonly known as:  KLONOPIN  Take 1 mg by mouth 2 (two) times daily as needed for anxiety.     ibuprofen 200 MG tablet  Commonly known as:  ADVIL,MOTRIN  Take 400 mg by mouth every 6 (six) hours as needed for mild pain.     oxyCODONE-acetaminophen 7.5-325 MG tablet  Commonly known as:  PERCOCET  Take 1 tablet by mouth every 4 (four) hours as needed for severe pain.     traMADol 50 MG tablet  Commonly known as:  ULTRAM  Take 1 tablet (50 mg total) by mouth every 6 (six) hours as needed.           Follow-up Information    Follow up with Harlo Jaso,MARIE-LYNE, MD In 3 weeks.   Specialty:  Obstetrics and Gynecology   Contact information:   [address] [place] [id number] [phone]       Signed: Genia DelLAVOIE,MARIE-LYNE, MD 06/17/2015, 10:13 AM

## 2015-06-17 NOTE — Transfer of Care (Signed)
Immediate Anesthesia Transfer of Care Note  Patient: Brianna Allen  Procedure(s) Performed: Procedure(s): DILATATION & CURETTAGE/HYSTEROSCOPY WITH NOVASURE ABLATION (N/A)  Patient Location: PACU  Anesthesia Type:General  Level of Consciousness: awake, alert  and oriented  Airway & Oxygen Therapy: Patient Spontanous Breathing and Patient connected to nasal cannula oxygen  Post-op Assessment: Report given to RN  Post vital signs: Reviewed  Last Vitals:  Filed Vitals:   06/17/15 0743  BP: 136/90  Pulse: 83  Temp: 36.7 C  Resp: 20    Last Pain: There were no vitals filed for this visit.    Patients Stated Pain Goal: 3 (06/17/15 0743)  Complications: No apparent anesthesia complications

## 2015-06-20 ENCOUNTER — Encounter (HOSPITAL_COMMUNITY): Payer: Self-pay | Admitting: Obstetrics & Gynecology

## 2015-06-27 NOTE — Anesthesia Postprocedure Evaluation (Signed)
Anesthesia Post Note  Patient: Verlin GrillsKelsey H Reichel  Procedure(s) Performed: Procedure(s) (LRB): DILATATION & CURETTAGE/HYSTEROSCOPY WITH NOVASURE ABLATION (N/A)  Patient location during evaluation: PACU Anesthesia Type: General Level of consciousness: sedated and awake Pain management: pain level controlled Vital Signs Assessment: vitals unstable Respiratory status: spontaneous breathing Cardiovascular status: stable Postop Assessment: no signs of nausea or vomiting Anesthetic complications: no     Last Vitals:  Filed Vitals:   06/17/15 1145 06/17/15 1210  BP: 124/70 126/79  Pulse: 57 55  Temp: 36.7 C 36.9 C  Resp: 16 16    Last Pain:  Filed Vitals:   06/20/15 1534  PainSc: 0-No pain   Pain Goal: Patients Stated Pain Goal: 4 (06/17/15 1210)               Kieley Akter JR,JOHN Susann GivensFRANKLIN

## 2015-11-01 ENCOUNTER — Other Ambulatory Visit: Payer: Self-pay | Admitting: Obstetrics & Gynecology

## 2015-11-01 ENCOUNTER — Other Ambulatory Visit: Payer: Self-pay

## 2015-11-01 DIAGNOSIS — R921 Mammographic calcification found on diagnostic imaging of breast: Secondary | ICD-10-CM

## 2015-11-01 DIAGNOSIS — N63 Unspecified lump in unspecified breast: Secondary | ICD-10-CM

## 2015-11-09 ENCOUNTER — Ambulatory Visit
Admission: RE | Admit: 2015-11-09 | Discharge: 2015-11-09 | Disposition: A | Discharge status: Home / Self Care | Payer: Managed Care, Other (non HMO) | Source: Ambulatory Visit | Attending: Family Medicine | Admitting: Family Medicine

## 2015-11-09 DIAGNOSIS — R921 Mammographic calcification found on diagnostic imaging of breast: Secondary | ICD-10-CM

## 2016-11-05 HISTORY — PX: ESOPHAGOGASTRODUODENOSCOPY: SHX1529

## 2016-12-04 ENCOUNTER — Other Ambulatory Visit: Payer: Self-pay | Admitting: General Practice

## 2016-12-04 DIAGNOSIS — Z1231 Encounter for screening mammogram for malignant neoplasm of breast: Secondary | ICD-10-CM

## 2017-01-03 ENCOUNTER — Ambulatory Visit
Admission: RE | Admit: 2017-01-03 | Discharge: 2017-01-03 | Disposition: A | Discharge status: Home / Self Care | Payer: Managed Care, Other (non HMO) | Source: Ambulatory Visit | Attending: General Practice | Admitting: General Practice

## 2017-01-03 DIAGNOSIS — Z1231 Encounter for screening mammogram for malignant neoplasm of breast: Secondary | ICD-10-CM

## 2017-10-09 ENCOUNTER — Other Ambulatory Visit: Payer: Self-pay | Admitting: Surgery

## 2017-10-09 DIAGNOSIS — Z9884 Bariatric surgery status: Secondary | ICD-10-CM

## 2017-10-17 ENCOUNTER — Ambulatory Visit
Admission: RE | Admit: 2017-10-17 | Discharge: 2017-10-17 | Disposition: A | Discharge status: Home / Self Care | Payer: Managed Care, Other (non HMO) | Source: Ambulatory Visit | Attending: Surgery | Admitting: Surgery

## 2017-10-17 ENCOUNTER — Other Ambulatory Visit: Payer: Self-pay | Admitting: Surgery

## 2017-10-17 DIAGNOSIS — Z9884 Bariatric surgery status: Secondary | ICD-10-CM

## 2017-11-06 ENCOUNTER — Telehealth: Payer: Self-pay | Admitting: Internal Medicine

## 2017-11-06 NOTE — Telephone Encounter (Signed)
Left message for patient to call back  

## 2017-11-06 NOTE — Telephone Encounter (Signed)
Spoke to Dr. Ezzard Standing last week  Patient has been seen by [facility]  He/She asking for evaluation by me re: Barrett's esophagus  Dr. Ezzard Standing has records - we may need to ask for them to send them  Schedule next available please

## 2017-11-07 NOTE — Telephone Encounter (Signed)
Patient is scheduled for 12/18/17 at 8:45.  I spoke with Selena Batten at [facility] she will send the records. New patient paperwork mailed to patient.

## 2017-11-07 NOTE — Telephone Encounter (Signed)
Left message for patient to call back  

## 2017-12-18 ENCOUNTER — Ambulatory Visit: Payer: Managed Care, Other (non HMO) | Admitting: Internal Medicine

## 2017-12-18 ENCOUNTER — Encounter: Payer: Self-pay | Admitting: Internal Medicine

## 2017-12-18 VITALS — BP 124/80 | HR 76 | Ht 69.0 in | Wt 219.0 lb

## 2017-12-18 DIAGNOSIS — K2270 Barrett's esophagus without dysplasia: Secondary | ICD-10-CM

## 2017-12-18 DIAGNOSIS — R1110 Vomiting, unspecified: Secondary | ICD-10-CM | POA: Diagnosis not present

## 2017-12-18 DIAGNOSIS — K3189 Other diseases of stomach and duodenum: Secondary | ICD-10-CM

## 2017-12-18 NOTE — Progress Notes (Signed)
Brianna Allen 47 y.o. 22-Aug-1970 [id number]  Assessment & Plan:   Encounter Diagnoses  Name Primary?  . Gastric stenosis Yes  . Regurgitation of food   . Barrett's esophagus without dysplasia - ?    When I saw her in the office I thought that the upper GI series had been done after she had had the fluid taken out of her band.  It turns out that is not the case so it may have been the fluid in the band was causing the problems.  When I saw her I thought that she might need surgery and reviewed that with Dr. Ezzard Standing but we have subsequently communicated and confirmed that the fluid was in the band at the time of her upper GI series  So I think she will either need an EGD or an upper GI series to reassess.  This does not make clear sense with all her regurgitation etc. that should have improved with removal of the fluid.  I will call her and discuss.  Another option would be to treat with metoclopramide.  Gastric emptying study is a consideration as well.  I am not convinced she has Barrett's esophagus based upon current guidelines it looks like she had an irregular Z line and some intestinal metaplasia.  I think the earliest she would need a repeat EGD to assess this would be 2021.  I appreciate the opportunity to care for this patient. CC: Shellia Cleverly, PA Dr. Ovidio Kin   Subjective:   Chief Complaint: Regurgitation Barrett's esophagus  HPI The patient is a 47 year old white woman status post lap band placement for bariatric weight loss 11 years ago by Dr. Ovidio Kin, with a 1-1/2-year history of reflux and regurgitation and some vomiting and dysphagia symptoms.  More regurgitation.  She was seen in [facility] by Dr. Arvid Right and had an EGD in late 2018, October 15 with an irregular Z line and intestinal metaplasia.  She says it was very bad experience she did not speak to him much she did not spend much time with her and the consult order after the endoscopy and she  received a phone call but little to no explanation that she needed a repeat EGD in 6 months from the last one.  She saw Dr. Ezzard Standing and is quite upset having problems with symptoms as mentioned above.  She had an upper GI on October 17, 2017 and the LAP-BAND was tight and he took all the fluid out of it.  When I saw her in the office I did not have the timeframe right and I thought that the upper GI was after the fluid was withdrawn.  She says she is about the same pre-and post upper GI however and pre-and post visit with Dr. Ezzard Standing.  Wt Readings from Last 3 Encounters:  12/18/17 219 lb (99.3 kg)  05/27/15 215 lb (97.5 kg)  05/20/14 205 lb (93 kg)     No Known Allergies Current Meds  Medication Sig  . clonazePAM (KLONOPIN) 1 MG tablet Take 1 mg by mouth 2 (two) times daily as needed for anxiety.   Marland Kitchen ibuprofen (ADVIL,MOTRIN) 200 MG tablet Take 400 mg by mouth every 6 (six) hours as needed for mild pain.  . pantoprazole (PROTONIX) 40 MG tablet Take 1 tablet by mouth 2 (two) times daily before a meal.   . traMADol (ULTRAM) 50 MG tablet Take 1 tablet (50 mg total) by mouth every 6 (six) hours as needed.  . [  DISCONTINUED] amitriptyline (ELAVIL) 50 MG tablet Take 1 tablet (50 mg total) by mouth at bedtime. (Patient taking differently: Take 50 mg by mouth at bedtime as needed (For plantar fasciitis flare up.). )   Past Medical History:  Diagnosis Date  . Anxiety   . Arthritis    right arm-titanium elbow joint-reduced flexion  . Barrett's esophagus   . Panic attacks   . Plantar fasciitis    Past Surgical History:  Procedure Laterality Date  . BREAST BIOPSY Left   . CESAREAN SECTION  [id number]  . CESAREAN SECTION  01/10/2011   Procedure: CESAREAN SECTION;  Surgeon: Genia DelMarie-Lyne Lavoie;  Location: [facility];  Service: Gynecology;  Laterality: N/A;  Repeat with  Bilateral tubal Litgation  . CHOLECYSTECTOMY  2000  . DILITATION & CURRETTAGE/HYSTROSCOPY WITH NOVASURE ABLATION N/A 06/17/2015    Procedure: DILATATION & CURETTAGE/HYSTEROSCOPY WITH NOVASURE ABLATION;  Surgeon: Genia DelMarie-Lyne Lavoie, MD;  Location: [facility];  Service: Gynecology;  Laterality: N/A;  . ELBOW SURGERY Right 97   titanium plate-does not have full extension  . ESOPHAGOGASTRODUODENOSCOPY  11/2016   [place]. [facility] [place]  . LAPAROSCOPIC GASTRIC BANDING  2009  . TONSILLECTOMY AND ADENOIDECTOMY    . TUBAL LIGATION    . 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   Review of Systems As per HPI all other review of systems appears negative  Objective:   Physical Exam  @BP  124/80   Pulse 76   Ht 5\' 9"  (1.753 m)   Wt 219 lb (99.3 kg)   BMI 32.34 kg/m @  General:  Well-developed, well-nourished and in no acute distress Eyes:  anicteric. Neck:   supple w/o thyromegaly or mass.  Lungs: Clear to auscultation bilaterally. Heart:  S1S2, no rubs, murmurs, gallops. Abdomen:  soft, non-tender, no hepatosplenomegaly, hernia, or mass and BS+.  There is a LAP-BAND port in the mid upper abdomen  Lymph:  no cervical or supraclavicular adenopathy. Extremities:   no edema, cyanosis or clubbing Skin   no rash. Neuro:  A&O x 3.  Psych:  appropriate mood and  Affect.   Data Reviewed:  See HPI

## 2017-12-18 NOTE — Patient Instructions (Addendum)
You will be due for a recall EGD in 11/2019. We will send you a reminder in the mail when it gets closer to that time.    Dr Ezzard StandingNewman will be calling you.    I appreciate the opportunity to care for you. Stan Headarl Gessner, MD, [facility]

## 2017-12-22 ENCOUNTER — Encounter: Payer: Self-pay | Admitting: Internal Medicine

## 2017-12-23 ENCOUNTER — Telehealth: Payer: Self-pay

## 2017-12-23 NOTE — Telephone Encounter (Signed)
Left message for patient to call back  

## 2017-12-23 NOTE — Telephone Encounter (Signed)
-----   Message from Iva Booparl E Gessner, MD sent at 12/23/2017  7:16 AM EST ----- Regarding: follow-up and correction Brianna Allen,  Please let her know that I was mistaken about sequence of things in her case. Dr. Ezzard StandingNewman and I have further communucated. My apologies about the mistake.  I now realize that the lap band was totally deflated after she had the upper GI - so since she is still having symptoms of regurgitation I recommend that she repeat an upper GI series to see how things are now then we will determine next steps in evaluation and treatment.  Let me know if she has ?  I have cced Dr. Ezzard StandingNewman so he is aware of plan.  Thanks  [facility]

## 2017-12-24 NOTE — Telephone Encounter (Signed)
Patient believes all of the fluid was removed from the band in August 10/03/17.  She is going to contact [facility] and get the dates of her appt.She has not returned to the clinic since. She remembers Dr. Ezzard StandingNewman called her after UGI and the fluid was out.  She is very reluctant to repeat the UGI due to cost.  She wants to speak with [facility] herself and will call back

## 2018-03-11 ENCOUNTER — Other Ambulatory Visit: Payer: Self-pay | Admitting: General Practice

## 2018-03-11 DIAGNOSIS — Z1231 Encounter for screening mammogram for malignant neoplasm of breast: Secondary | ICD-10-CM

## 2018-04-09 ENCOUNTER — Ambulatory Visit
Admission: RE | Admit: 2018-04-09 | Discharge: 2018-04-09 | Disposition: A | Discharge status: Home / Self Care | Payer: PRIVATE HEALTH INSURANCE | Source: Ambulatory Visit | Attending: General Practice | Admitting: General Practice

## 2018-04-09 DIAGNOSIS — Z1231 Encounter for screening mammogram for malignant neoplasm of breast: Secondary | ICD-10-CM

## 2018-09-23 ENCOUNTER — Other Ambulatory Visit: Payer: Self-pay | Admitting: Surgery

## 2018-09-23 DIAGNOSIS — Z9884 Bariatric surgery status: Secondary | ICD-10-CM

## 2018-09-30 ENCOUNTER — Ambulatory Visit
Admission: RE | Admit: 2018-09-30 | Discharge: 2018-09-30 | Disposition: A | Discharge status: Home / Self Care | Payer: PRIVATE HEALTH INSURANCE | Source: Ambulatory Visit | Attending: Surgery | Admitting: Surgery

## 2018-09-30 ENCOUNTER — Other Ambulatory Visit: Payer: Self-pay | Admitting: Surgery

## 2018-09-30 DIAGNOSIS — Z9884 Bariatric surgery status: Secondary | ICD-10-CM

## 2018-10-02 ENCOUNTER — Other Ambulatory Visit (HOSPITAL_COMMUNITY): Payer: Self-pay | Admitting: Surgery

## 2018-10-29 NOTE — Patient Instructions (Addendum)
DUE TO COVID-19 ONLY ONE VISITOR IS ALLOWED TO COME WITH YOU AND STAY IN THE WAITING ROOM ONLY DURING PRE OP AND PROCEDURE DAY OF SURGERY.  THE 1 VISITOR MAY VISIT WITH YOU AFTER SURGERY IN YOUR PRIVATE ROOM DURING VISITING HOURS ONLY!   YOU NEED TO HAVE A COVID 19 TEST ON__Friday 9/25_____ @ 2:40_______, THIS TEST MUST BE DONE BEFORE SURGERY, COME  [address],  [place] , [address].  [facility])  ONCE YOUR COVID TEST IS COMPLETED, PLEASE BEGIN THE QUARANTINE INSTRUCTIONS AS OUTLINED IN YOUR HANDOUT.                Jalyne Brodzinski   Your procedure is scheduled on: Tuesday 11/04/18   Report to [facility] Main  Entrance   Report to admitting at  8:15 AM     Call this number if you have problems the morning of surgery [phone]    Remember: Do not eat food or drink liquids :After Midnight.   BRUSH YOUR TEETH MORNING OF SURGERY AND RINSE YOUR MOUTH OUT, NO CHEWING GUM CANDY OR MINTS.     Take these medicines the morning of surgery with A SIP OF WATER: Klonipin  If needed, Protonix                                 You may not have any metal on your body including hair pins and              piercings               Do not wear jewelry, make-up, lotions, powders or perfumes, deodorant             Do not wear nail polish on your fingernails.  Do not shave  48 hours prior to surgery.               Do not bring valuables to the hospital. [facility] IS NOT             RESPONSIBLE   FOR VALUABLES.  Contacts, dentures or bridgework may not be worn into surgery..     Patients discharged the day of surgery will not be allowed to drive home.  IF YOU ARE HAVING SURGERY AND GOING HOME THE SAME DAY, YOU MUST HAVE AN ADULT TO DRIVE YOU HOME AND BE WITH YOU FOR 24 HOURS.  YOU MAY GO HOME BY TAXI OR UBER OR ORTHERWISE, BUT AN ADULT MUST ACCOMPANY YOU HOME AND STAY WITH YOU FOR 24 HOURS.  Name and phone number of your driver:  Special Instructions:  N/A              Please read over the following fact sheets you were given: _____________________________________________________________________             [facility] - Preparing for Surgery  Before surgery, you can play an important role.   Because skin is not sterile, your skin needs to be as free of germs as possible.   You can reduce the number of germs on your skin by washing with CHG (chlorahexidine gluconate) soap before surgery.   CHG is an antiseptic cleaner which kills germs and bonds with the skin to continue killing germs even after washing. Please DO NOT use if you have an allergy to CHG or antibacterial soaps .  If your skin becomes reddened/irritated stop using the CHG and inform  your nurse when you arrive at Short Stay. Do not shave (including legs and underarms) for at least 48 hours prior to the first CHG shower.    Please follow these instructions carefully:  1.  Shower with CHG Soap the night before surgery and the  morning of Surgery.  2.  If you choose to wash your hair, wash your hair first as usual with your  normal  shampoo.  3.  After you shampoo, rinse your hair and body thoroughly to remove the  shampoo.                                        4.  Use CHG as you would any other liquid soap.  You can apply chg directly  to the skin and wash                       Gently with a scrungie or clean washcloth.  5.  Apply the CHG Soap to your body ONLY FROM THE NECK DOWN.   Do not use on face/ open                           Wound or open sores. Avoid contact with eyes, ears mouth and genitals (private parts).                       Wash face,  Genitals (private parts) with your normal soap.             6.  Wash thoroughly, paying special attention to the area where your surgery  will be performed.  7.  Thoroughly rinse your body with warm water from the neck down.  8.  DO NOT shower/wash with your normal soap after using and rinsing off  the CHG Soap.             9.   Pat yourself dry with a clean towel.            10.  Wear clean pajamas.            11.  Place clean sheets on your bed the night of your first shower and do not  sleep with pets. Day of Surgery : Do not apply any lotions/deodorants the morning of surgery.  Please wear clean clothes to the hospital/surgery center.  FAILURE TO FOLLOW THESE INSTRUCTIONS MAY RESULT IN THE CANCELLATION OF YOUR SURGERY PATIENT SIGNATURE_________________________________  NURSE SIGNATURE__________________________________  ________________________________________________________________________

## 2018-10-30 ENCOUNTER — Encounter (HOSPITAL_COMMUNITY)
Admission: RE | Admit: 2018-10-30 | Discharge: 2018-10-30 | Disposition: A | Discharge status: Home / Self Care | Payer: PRIVATE HEALTH INSURANCE | Source: Ambulatory Visit | Attending: Surgery | Admitting: Surgery

## 2018-10-30 ENCOUNTER — Other Ambulatory Visit: Payer: Self-pay

## 2018-10-30 ENCOUNTER — Encounter (HOSPITAL_COMMUNITY): Payer: Self-pay

## 2018-10-30 DIAGNOSIS — Z01812 Encounter for preprocedural laboratory examination: Secondary | ICD-10-CM | POA: Insufficient documentation

## 2018-10-30 LAB — COMPREHENSIVE METABOLIC PANEL
ALT: 14 U/L (ref 0–44)
AST: 17 U/L (ref 15–41)
Albumin: 4.2 g/dL (ref 3.5–5.0)
Alkaline Phosphatase: 72 U/L (ref 38–126)
Anion gap: 9 (ref 5–15)
BUN: 10 mg/dL (ref 6–20)
CO2: 23 mmol/L (ref 22–32)
Calcium: 8.8 mg/dL — ABNORMAL LOW (ref 8.9–10.3)
Chloride: 106 mmol/L (ref 98–111)
Creatinine, Ser: 0.58 mg/dL (ref 0.44–1.00)
GFR calc Af Amer: >60 mL/min (ref >60)
GFR calc non Af Amer: >60 mL/min (ref >60)
Glucose, Bld: 105 mg/dL — ABNORMAL HIGH (ref 70–99)
Potassium: 4.1 mmol/L (ref 3.5–5.1)
Sodium: 138 mmol/L (ref 135–145)
Total Bilirubin: 0.8 mg/dL (ref 0.3–1.2)
Total Protein: 7.4 g/dL (ref 6.5–8.1)

## 2018-10-30 LAB — CBC WITH DIFFERENTIAL/PLATELET
Abs Immature Granulocytes: 0.03 10*3/uL (ref 0.00–0.07)
Basophils Absolute: 0.1 10*3/uL (ref 0.0–0.1)
Basophils Relative: 1 %
Eosinophils Absolute: 0.1 10*3/uL (ref 0.0–0.5)
Eosinophils Relative: 2 %
HCT: 40.3 % (ref 36.0–46.0)
Hemoglobin: 12.5 g/dL (ref 12.0–15.0)
Immature Granulocytes: 0 %
Lymphocytes Relative: 29 %
Lymphs Abs: 2 10*3/uL (ref 0.7–4.0)
MCH: 26.3 pg (ref 26.0–34.0)
MCHC: 31 g/dL (ref 30.0–36.0)
MCV: 84.7 fL (ref 80.0–100.0)
Monocytes Absolute: 0.5 10*3/uL (ref 0.1–1.0)
Monocytes Relative: 8 %
Neutro Abs: 4.2 10*3/uL (ref 1.7–7.7)
Neutrophils Relative %: 60 %
Platelets: 305 10*3/uL (ref 150–400)
RBC: 4.76 MIL/uL (ref 3.87–5.11)
RDW: 14.6 % (ref 11.5–15.5)
WBC: 7 10*3/uL (ref 4.0–10.5)
nRBC: 0 % (ref 0.0–0.2)

## 2018-10-30 NOTE — Progress Notes (Signed)
PCP - Lanier Prude PA Cardiologist - none  Chest x-ray - no EKG - no Stress Test - no ECHO - noCardiac Cath -   Sleep Study - no CPAP -   Fasting Blood Sugar - NAChecks Blood Sugar _____ times a day  Blood Thinner Instructions:NAAspirin Instructions: Last Dose:  Anesthesia review:   Patient denies shortness of breath, fever, cough and chest pain at PAT appointment yes Patient verbalized understanding of instructions that were given to them at the PAT appointment. Patient was also instructed that they will need to review over the PAT instructions again at home before surgery. Yes Pt states that Rt arm has painful hardware in it and requests not to have BP taken in that arm.

## 2018-10-31 ENCOUNTER — Encounter (HOSPITAL_COMMUNITY): Payer: Self-pay | Admitting: Physician Assistant

## 2018-10-31 ENCOUNTER — Other Ambulatory Visit (HOSPITAL_COMMUNITY): Payer: PRIVATE HEALTH INSURANCE

## 2018-11-01 ENCOUNTER — Other Ambulatory Visit (HOSPITAL_COMMUNITY): Payer: Self-pay

## 2018-11-01 ENCOUNTER — Other Ambulatory Visit (HOSPITAL_COMMUNITY): Admission: RE | Admit: 2018-11-01 | Payer: Self-pay | Source: Ambulatory Visit

## 2018-11-04 ENCOUNTER — Ambulatory Visit (HOSPITAL_COMMUNITY): Admission: RE | Admit: 2018-11-04 | Payer: PRIVATE HEALTH INSURANCE | Source: Home / Self Care | Admitting: Surgery

## 2018-11-04 ENCOUNTER — Encounter (HOSPITAL_COMMUNITY): Admission: RE | Payer: Self-pay | Source: Home / Self Care

## 2018-11-04 SURGERY — REMOVAL, GASTRIC BAND, LAPAROSCOPIC
Anesthesia: General

## 2019-02-26 ENCOUNTER — Other Ambulatory Visit (HOSPITAL_COMMUNITY): Payer: Self-pay | Admitting: Surgery

## 2019-03-04 NOTE — Patient Instructions (Addendum)
DUE TO COVID-19 ONLY ONE VISITOR IS ALLOWED TO COME WITH YOU AND STAY IN THE WAITING ROOM ONLY DURING PRE OP AND PROCEDURE DAY OF SURGERY. THE 1 VISITOR MAY VISIT WITH YOU AFTER SURGERY IN YOUR PRIVATE ROOM DURING VISITING HOURS ONLY!  YOU NEED TO HAVE A COVID 19 TEST ON__1/28_____ @__1 :05_____, THIS TEST MUST BE DONE BEFORE SURGERY, COME  [address], [place] , [address].  ([place]) ONCE YOUR COVID TEST IS COMPLETED, PLEASE BEGIN THE QUARANTINE INSTRUCTIONS AS OUTLINED IN YOUR HANDOUT.                Mat Carne    Your procedure is scheduled on:    Report to [facility] Main  Entrance   Report to admitting at 7:30 AM     Call this number if you have problems the morning of surgery [phone]    . BRUSH YOUR TEETH MORNING OF SURGERY AND RINSE YOUR MOUTH OUT, NO CHEWING GUM CANDY OR MINTS.   Clear liquids until 6:30 AM then nothing by mouth  CLEAR LIQUID DIET   Foods Allowed                                                                     Foods Excluded  Coffee and tea, regular and decaf                             liquids that you cannot  Plain Jell-O any favor except red or purple                                           see through such as: Fruit ices (not with fruit pulp)                                     milk, soups, orange juice  Iced Popsicles                                    All solid food Carbonated beverages, regular and diet                                    Cranberry, grape and apple juices Sports drinks like Gatorade Lightly seasoned clear broth or consume(fat free) Sugar, honey syrup   _____________________________________________________________________     Take these medicines the morning of surgery with A SIP OF WATER: Prilosec, Klonopin if needed                                 You may not have any metal on your body including hair pins and              piercings  Do not wear jewelry, make-up,  lotions, powders or perfumes, deodorant  Do not wear nail polish on your fingernails.  Do not shave  48 hours prior to surgery.     Do not bring valuables to the hospital. [facility] IS NOT             RESPONSIBLE   FOR VALUABLES.  Contacts, dentures or bridgework may not be worn into surgery.     Patients discharged the day of surgery will not be allowed to drive home.  IF YOU ARE HAVING SURGERY AND GOING HOME THE SAME DAY, YOU MUST HAVE AN ADULT TO DRIVE YOU HOME AND BE WITH YOU FOR 24 HOURS.  YOU MAY GO HOME BY TAXI OR UBER OR ORTHERWISE, BUT AN ADULT MUST ACCOMPANY YOU HOME AND STAY WITH YOU FOR 24 HOURS.  Name and phone number of your driver:  Special Instructions: N/A              Please read over the following fact sheets you were given: _____________________________________________________________________             [facility] - Preparing for Surgery  Before surgery, you can play an important role.   Because skin is not sterile, your skin needs to be as free of germs as possible .  You can reduce the number of germs on your skin by washing with CHG (chlorahexidine gluconate) soap before surgery.    CHG is an antiseptic cleaner which kills germs and bonds with the skin to continue killing germs even after washing. Please DO NOT use if you have an allergy to CHG or antibacterial soaps.  If your skin becomes reddened/irritated stop using the CHG and inform your nurse when you arrive at Short Stay. Do not shave (including legs and underarms) for at least 48 hours prior to the first CHG shower.   Please follow these instructions carefully:  1.  Shower with CHG Soap the night before surgery and the  morning of Surgery.  2.  If you choose to wash your hair, wash your hair first as usual with your  normal  shampoo.  3.  After you shampoo, rinse your hair and body thoroughly to remove the  shampoo.                                        4.  Use CHG as you would any  other liquid soap.  You can apply chg directly  to the skin and wash                       Gently with a scrungie or clean washcloth.  5.  Apply the CHG Soap to your body ONLY FROM THE NECK DOWN.   Do not use on face/ open                           Wound or open sores. Avoid contact with eyes, ears mouth and genitals (private parts).                       Wash face,  Genitals (private parts) with your normal soap.             6.  Wash thoroughly, paying special attention to the area where your surgery  will be performed.  7.  Thoroughly rinse your body with warm water from the neck  down.  8.  DO NOT shower/wash with your normal soap after using and rinsing off  the CHG Soap.             9.  Pat yourself dry with a clean towel.            10.  Wear clean pajamas.            11.  Place clean sheets on your bed the night of your first shower and do not  sleep with pets. Day of Surgery : Do not apply any lotions/deodorants the morning of surgery.  Please wear clean clothes to the hospital/surgery center.  FAILURE TO FOLLOW THESE INSTRUCTIONS MAY RESULT IN THE CANCELLATION OF YOUR SURGERY PATIENT SIGNATURE_________________________________  NURSE SIGNATURE__________________________________  ________________________________________________________________________

## 2019-03-05 ENCOUNTER — Encounter (HOSPITAL_COMMUNITY): Admission: RE | Admit: 2019-03-05 | Payer: 59 | Source: Ambulatory Visit

## 2019-03-05 ENCOUNTER — Encounter (HOSPITAL_COMMUNITY)
Admission: RE | Admit: 2019-03-05 | Discharge: 2019-03-05 | Disposition: A | Discharge status: Home / Self Care | Payer: 59 | Source: Ambulatory Visit | Attending: Surgery | Admitting: Surgery

## 2019-03-05 ENCOUNTER — Inpatient Hospital Stay (HOSPITAL_COMMUNITY): Admission: RE | Admit: 2019-03-05 | Payer: 59 | Source: Ambulatory Visit

## 2019-03-05 ENCOUNTER — Other Ambulatory Visit (HOSPITAL_COMMUNITY): Payer: 59

## 2019-03-05 ENCOUNTER — Encounter (HOSPITAL_COMMUNITY): Payer: Self-pay | Admitting: Physician Assistant

## 2019-03-20 ENCOUNTER — Encounter (HOSPITAL_COMMUNITY): Admission: RE | Admit: 2019-03-20 | Payer: 59 | Source: Ambulatory Visit | Admitting: Surgery

## 2019-03-20 ENCOUNTER — Encounter (HOSPITAL_COMMUNITY): Payer: 59

## 2019-05-11 NOTE — Patient Instructions (Signed)
DUE TO COVID-19 ONLY ONE VISITOR IS ALLOWED TO COME WITH YOU AND STAY IN THE WAITING ROOM ONLY DURING PRE OP AND PROCEDURE DAY OF SURGERY. THE 2 VISITORS MAY VISIT WITH YOU AFTER SURGERY IN YOUR PRIVATE ROOM DURING VISITING HOURS ONLY!  YOU NEED TO HAVE A COVID 19 TEST ON__4/9_____ @_3 :25p______, THIS TEST MUST BE DONE BEFORE SURGERY, COME  [place] , [address].  ([place]) ONCE YOUR COVID TEST IS COMPLETED, PLEASE BEGIN THE QUARANTINE INSTRUCTIONS AS OUTLINED IN YOUR HANDOUT.                Sergio Hobart    Your procedure is scheduled on: 05/19/19   Report to [facility] Main  Entrance   Report to Short Stay at 5:30 AM     Call this number if you have problems the morning of surgery [phone]    Remember: Do not eat food after Midnight.  Clear liquids until 4:30 AM then nothing by mouth.     BRUSH YOUR TEETH MORNING OF SURGERY AND RINSE YOUR MOUTH OUT, NO CHEWING GUM CANDY OR MINTS.     Take these medicines the morning of surgery with A SIP OF WATER: Clonazepam, Protonix                                 You may not have any metal on your body including hair pins and              piercings  Do not wear jewelry, make-up, lotions, powders or perfumes, deodorant             Do not wear nail polish on your fingernails.  Do not shave  48 hours prior to surgery.     Do not bring valuables to the hospital. [facility].  Contacts, dentures or bridgework may not be worn into surgery.       Special Instructions: If you go home the same day as surgery you must have someone with you for 24 hours              Please read over the following fact sheets you were given: _____________________________________________________________________             [facility] - Preparing for Surgery Before surgery, you can play an important role.   Because skin is not sterile, your skin needs  to be as free of germs as possible.   You can reduce the number of germs on your skin by washing with CHG (chlorahexidine gluconate) soap before surgery.   CHG is an antiseptic cleaner which kills germs and bonds with the skin to continue killing germs even after washing. Please DO NOT use if you have an allergy to CHG or antibacterial soaps.   If your skin becomes reddened/irritated stop using the CHG and inform your nurse when you arrive at Short Stay. Do not shave (including legs and underarms) for at least 48 hours prior to the first CHG shower.    Please follow these instructions carefully:  1.  Shower with CHG Soap the night before surgery and the  morning of Surgery.  2.  If you choose to wash your hair, wash your hair first as usual with your  normal  shampoo.  3.  After you shampoo,  rinse your hair and body thoroughly to remove the  shampoo.                                        4.  Use CHG as you would any other liquid soap.  You can apply chg directly  to the skin and wash                       Gently with a scrungie or clean washcloth.  5.  Apply the CHG Soap to your body ONLY FROM THE NECK DOWN.   Do not use on face/ open                           Wound or open sores. Avoid contact with eyes, ears mouth and genitals (private parts).                       Wash face,  Genitals (private parts) with your normal soap.             6.  Wash thoroughly, paying special attention to the area where your surgery  will be performed.  7.  Thoroughly rinse your body with warm water from the neck down.  8.  DO NOT shower/wash with your normal soap after using and rinsing off  the CHG Soap.             9.  Pat yourself dry with a clean towel.            10.  Wear clean pajamas.            11.  Place clean sheets on your bed the night of your first shower and do not  sleep with pets. Day of Surgery : Do not apply any lotions/deodorants the morning of surgery.  Please wear clean clothes to the  hospital/surgery center.  FAILURE TO FOLLOW THESE INSTRUCTIONS MAY RESULT IN THE CANCELLATION OF YOUR SURGERY PATIENT SIGNATURE_________________________________  NURSE SIGNATURE__________________________________  ________________________________________________________________________

## 2019-05-12 ENCOUNTER — Other Ambulatory Visit: Payer: Self-pay

## 2019-05-12 ENCOUNTER — Encounter (HOSPITAL_COMMUNITY): Payer: Self-pay

## 2019-05-12 ENCOUNTER — Encounter (HOSPITAL_COMMUNITY)
Admission: RE | Admit: 2019-05-12 | Discharge: 2019-05-12 | Disposition: A | Discharge status: Home / Self Care | Payer: 59 | Source: Ambulatory Visit | Attending: Surgery | Admitting: Surgery

## 2019-05-12 DIAGNOSIS — Z01812 Encounter for preprocedural laboratory examination: Secondary | ICD-10-CM | POA: Diagnosis present

## 2019-05-12 LAB — CBC WITH DIFFERENTIAL/PLATELET
Abs Immature Granulocytes: 0.01 10*3/uL (ref 0.00–0.07)
Basophils Absolute: 0.1 10*3/uL (ref 0.0–0.1)
Basophils Relative: 1 %
Eosinophils Absolute: 0.1 10*3/uL (ref 0.0–0.5)
Eosinophils Relative: 3 %
HCT: 41.5 % (ref 36.0–46.0)
Hemoglobin: 13 g/dL (ref 12.0–15.0)
Immature Granulocytes: 0 %
Lymphocytes Relative: 31 %
Lymphs Abs: 1.8 10*3/uL (ref 0.7–4.0)
MCH: 27 pg (ref 26.0–34.0)
MCHC: 31.3 g/dL (ref 30.0–36.0)
MCV: 86.3 fL (ref 80.0–100.0)
Monocytes Absolute: 0.6 10*3/uL (ref 0.1–1.0)
Monocytes Relative: 10 %
Neutro Abs: 3.1 10*3/uL (ref 1.7–7.7)
Neutrophils Relative %: 55 %
Platelets: 325 10*3/uL (ref 150–400)
RBC: 4.81 MIL/uL (ref 3.87–5.11)
RDW: 15.2 % (ref 11.5–15.5)
WBC: 5.6 10*3/uL (ref 4.0–10.5)
nRBC: 0 % (ref 0.0–0.2)

## 2019-05-12 LAB — COMPREHENSIVE METABOLIC PANEL
ALT: 11 U/L (ref 0–44)
AST: 14 U/L — ABNORMAL LOW (ref 15–41)
Albumin: 3.9 g/dL (ref 3.5–5.0)
Alkaline Phosphatase: 67 U/L (ref 38–126)
Anion gap: 9 (ref 5–15)
BUN: 10 mg/dL (ref 6–20)
CO2: 25 mmol/L (ref 22–32)
Calcium: 9.1 mg/dL (ref 8.9–10.3)
Chloride: 105 mmol/L (ref 98–111)
Creatinine, Ser: 0.66 mg/dL (ref 0.44–1.00)
GFR calc Af Amer: >60 mL/min (ref >60)
GFR calc non Af Amer: >60 mL/min (ref >60)
Glucose, Bld: 102 mg/dL — ABNORMAL HIGH (ref 70–99)
Potassium: 3.8 mmol/L (ref 3.5–5.1)
Sodium: 139 mmol/L (ref 135–145)
Total Bilirubin: 0.6 mg/dL (ref 0.3–1.2)
Total Protein: 7 g/dL (ref 6.5–8.1)

## 2019-05-12 NOTE — Progress Notes (Signed)
PCP - Daphane Shepherd Cardiologist - no  Chest x-ray - no EKG - no Stress Test -no  ECHO - no Cardiac Cath - no  Sleep Study - no CPAP -   Fasting Blood Sugar - NA Checks Blood Sugar _____ times a day  Blood Thinner Instructions:NA Aspirin Instructions: Last Dose:  Anesthesia review:   Patient denies shortness of breath, fever, cough and chest pain at PAT appointment yes  Patient verbalized understanding of instructions that were given to them at the PAT appointment. Patient was also instructed that they will need to review over the PAT instructions again at home before surgery. yes

## 2019-05-15 ENCOUNTER — Other Ambulatory Visit (HOSPITAL_COMMUNITY): Payer: 59

## 2019-05-19 ENCOUNTER — Ambulatory Visit (HOSPITAL_COMMUNITY): Admission: RE | Admit: 2019-05-19 | Payer: 59 | Source: Home / Self Care | Admitting: Surgery

## 2019-05-19 ENCOUNTER — Encounter (HOSPITAL_COMMUNITY): Admission: RE | Payer: Self-pay | Source: Home / Self Care

## 2019-05-19 SURGERY — LAPAROSCOPY, DIAGNOSTIC
Anesthesia: General

## 2020-01-11 ENCOUNTER — Other Ambulatory Visit: Payer: Self-pay | Admitting: General Practice

## 2020-01-11 DIAGNOSIS — Z1231 Encounter for screening mammogram for malignant neoplasm of breast: Secondary | ICD-10-CM

## 2020-01-20 ENCOUNTER — Other Ambulatory Visit: Payer: Self-pay

## 2020-01-20 ENCOUNTER — Ambulatory Visit
Admission: RE | Admit: 2020-01-20 | Discharge: 2020-01-20 | Disposition: A | Discharge status: Home / Self Care | Payer: 59 | Source: Ambulatory Visit

## 2020-01-20 DIAGNOSIS — Z1231 Encounter for screening mammogram for malignant neoplasm of breast: Secondary | ICD-10-CM

## 2020-03-07 ENCOUNTER — Encounter: Payer: Self-pay | Admitting: Internal Medicine

## 2021-03-02 ENCOUNTER — Encounter: Payer: Self-pay | Admitting: Internal Medicine

## 2021-03-02 ENCOUNTER — Ambulatory Visit: Payer: 59 | Admitting: Internal Medicine

## 2021-03-02 VITALS — BP 130/84 | HR 76 | Ht 69.0 in | Wt 226.0 lb

## 2021-03-02 DIAGNOSIS — K920 Hematemesis: Secondary | ICD-10-CM

## 2021-03-02 DIAGNOSIS — K219 Gastro-esophageal reflux disease without esophagitis: Secondary | ICD-10-CM | POA: Diagnosis not present

## 2021-03-02 DIAGNOSIS — K3189 Other diseases of stomach and duodenum: Secondary | ICD-10-CM

## 2021-03-02 DIAGNOSIS — K2270 Barrett's esophagus without dysplasia: Secondary | ICD-10-CM | POA: Diagnosis not present

## 2021-03-02 MED ORDER — LANSOPRAZOLE 30 MG PO TBDD: 30.0000 mg | DELAYED_RELEASE_TABLET | Freq: Two times a day (BID) | ORAL | 5 refills | Status: DC

## 2021-03-02 NOTE — Patient Instructions (Addendum)
If you are age 49 or older, your body mass index should be between 23-30. Your Body mass index is 33.37 kg/m. If this is out of the aforementioned range listed, please consider follow up with your Primary Care Provider.  If you are age 26 or younger, your body mass index should be between 19-25. Your Body mass index is 33.37 kg/m. If this is out of the aformentioned range listed, please consider follow up with your Primary Care Provider.   ________________________________________________________  The [facility] providers would like to encourage you to use [facility] to communicate with providers for non-urgent requests or questions.  Due to long hold times on the telephone, sending your provider a message by [facility] may be a faster and more efficient way to get a response.  Please allow 48 business hours for a response.  Please remember that this is for non-urgent requests.  _______________________________________________________  Brianna Allen have been scheduled for an endoscopy. Please follow written instructions given to you at your visit today. If you use inhalers (even only as needed), please bring them with you on the day of your procedure.  Stop your pantoprazole and start lansoprazole. This has been sent in.   I appreciate the opportunity to care for you. Stan Head, MD, [facility]

## 2021-03-02 NOTE — Progress Notes (Signed)
Chrsitina Allen 51 y.o. [birth year] [id number]  Assessment & Plan:   Encounter Diagnoses  Name Primary?   Gastroesophageal reflux disease, unspecified whether esophagitis present Yes   Barrett's esophagus without dysplasia    Hematemesis, unspecified whether nausea present    Gastric stenosis    I think her problems are related to the gastric pouch outlet obstruction known since 2020. We will look at mucosa to reassess. Do at hospital - aspiration risk is increased + have ultraslim scope available which could facilitate passage through the stenosis/stricture seen on upper GI series.  Clear liquids day before procedure.  The risks and benefits as well as alternatives of endoscopic procedure(s) have been discussed and reviewed. All questions answered. The patient agrees to proceed.  Ultimately I think this gastric pouch has to be removed.  Unfortunately that does not sound financially possible for the patient.  CC: Brianna Allen, [place]  Subjective:   Chief Complaint: Reflux problems  HPI 51 year old white woman seen last in 2019 because of gastric stenosis status post laparoscopic band procedure.  She has chronic heartburn and reflux and regurgitation symptoms and has difficulty tolerating anything beyond the consistency of mashed potatoes.  She has dysphagia type symptoms.  Upper GI series on 2 occasions has shown a severe gastric stenosis at the site of her laparoscopic band despite all of the fluid being out of the band.  She was to have this "malfunctioning Lap-Band" removed by Dr. [name] in 2020 but she says her insurance would not pay for it because it was bariatric in nature and she could not afford it so she never had it done.  She is very concerned about aspiration, fearful she may die from this, fearful about damage to her esophagus because of a diagnosis of Barrett's esophagus (only an irregular Z-line) and would like to have a reassessment.  She is tearful as she speaks.   She also says sometimes she spits up or vomits small amounts of red blood. Though she takes her medications sometimes they are regurgitated so question how effective her PPI is.  She also has as needed NSAIDs on her list.  Here is the history in 2019: The patient is a 51 year old white woman status post lap band placement for bariatric weight loss 11 years ago by Dr. Alphonsa Overall, with a 1-1/2-year history of reflux and regurgitation and some vomiting and dysphagia symptoms.  More regurgitation.  She was seen in [facility] by Dr. [name] and had an EGD in late 2018, October 15 with an irregular Z line and intestinal metaplasia.  She says it was very bad experience she did not speak to him much she did not spend much time with her and the consult order after the endoscopy and she received a phone call but little to no explanation that she needed a repeat EGD in 6 months from the last one.  She saw Dr. [name] and is quite upset having problems with symptoms as mentioned above.  She had an upper GI on October 17, 2017 and the LAP-BAND was tight and he took all the fluid out of it.  When I saw her in the office I did not have the timeframe right and I thought that the upper GI was after the fluid was withdrawn.  She says she is about the same pre-and post upper GI however and pre-and post visit with Dr. [name] Readings from Last 3 Encounters:  03/02/21 226 lb (102.5 kg)  05/12/19  220 lb 7 oz (100 kg)  10/30/18 233 lb (105.7 kg)     No Known Allergies Current Meds  Medication Sig   Cholecalciferol 125 MCG (5000 UT) capsule Take 1 capsule by mouth daily.   clonazePAM (KLONOPIN) 1 MG tablet Take 1 mg by mouth daily as needed (sleep/anxiety.).    Ferrous Sulfate (IRON) 325 (65 Fe) MG TABS Take 1 tablet by mouth daily at 2 PM.   ibuprofen (ADVIL,MOTRIN) 200 MG tablet Take 400-800 mg by mouth every 8 (eight) hours as needed for mild pain (pain.).        rosuvastatin (CRESTOR) 10 MG tablet Take  10 mg by mouth every evening.    tiZANidine (ZANAFLEX) 4 MG tablet Take by mouth.   traMADol (ULTRAM) 50 MG tablet Take by mouth.   [pantoprazole (PROTONIX) 40 MG tablet Take 40 mg by mouth 2 (two) times daily.    Past Medical History:  Diagnosis Date   Anxiety    Arthritis    right arm-titanium elbow joint-reduced flexion   Barrett's esophagus    Panic attacks    Plantar fasciitis    Vitamin D deficiency    Past Surgical History:  Procedure Laterality Date   BREAST BIOPSY Left    CESAREAN SECTION  [id number]   CESAREAN SECTION  01/10/2011   Procedure: CESAREAN SECTION;  Surgeon: Princess Bruins;  Location: [facility];  Service: Gynecology;  Laterality: N/A;  Repeat with  Bilateral tubal Litgation   CHOLECYSTECTOMY  2000   DILITATION & CURRETTAGE/HYSTROSCOPY WITH NOVASURE ABLATION N/A 06/17/2015   Procedure: DILATATION & CURETTAGE/HYSTEROSCOPY WITH NOVASURE ABLATION;  Surgeon: Princess Bruins, MD;  Location: [facility];  Service: Gynecology;  Laterality: N/A;   ELBOW SURGERY Right 97   titanium plate-does not have full extension   ESOPHAGOGASTRODUODENOSCOPY  11/2016   Dr Osborne Oman in [place], [place] Right 2002   robs   JOINT REPLACEMENT Right    t bar joint   LAPAROSCOPIC GASTRIC BANDING  2009   TONSILLECTOMY AND ADENOIDECTOMY     TUBAL LIGATION     WISDOM TOOTH EXTRACTION     Social History   Social History Narrative   She is married and has 3 children, daughters 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   Review of Systems As per HPI  Objective:   Physical Exam BP 130/84    Pulse 76    Ht 5\' 9"  (1.753 m)    Wt 226 lb (102.5 kg)    SpO2 100%    BMI 33.37 kg/m  Obese, anxious and tearful Lungs cta [id number] no rmg Abdomen soft, NT -  lap band port RUQ

## 2021-03-03 DIAGNOSIS — K3189 Other diseases of stomach and duodenum: Secondary | ICD-10-CM | POA: Insufficient documentation

## 2021-03-03 DIAGNOSIS — K219 Gastro-esophageal reflux disease without esophagitis: Secondary | ICD-10-CM | POA: Insufficient documentation

## 2021-03-06 ENCOUNTER — Other Ambulatory Visit: Payer: Self-pay | Admitting: Internal Medicine

## 2021-03-06 ENCOUNTER — Telehealth: Payer: Self-pay

## 2021-03-06 MED ORDER — ESOMEPRAZOLE MAGNESIUM 40 MG PO CPDR: 40.0000 mg | DELAYED_RELEASE_CAPSULE | Freq: Two times a day (BID) | ORAL | 5 refills | Status: AC

## 2021-03-06 NOTE — Telephone Encounter (Signed)
-----   Message from Iva Boop, MD sent at 03/06/2021  8:23 AM EST ----- DC this  I Rxed esomeprazole and she needs to open the capsules and swallow granules (see rx sig)   ----- Message ----- From: [place], Lamberto Dinapoli E, CMA Sent: 03/03/2021   4:34 PM EST To: Iva Boop, MD  Dr Leone Payor a PA came back for BID dosing on the lansoprazole ODT 30mg  tablets. I tried to call and start the PA and it was going to be 1.5 hours to 2 hours to get a customer service rep. I called the pharmacy and cash price is over [phone] and with Good rx will be over $261. Please advise?

## 2021-03-06 NOTE — Telephone Encounter (Signed)
I called and left Brianna Allen a detailed message about why we switched her rx.

## 2021-03-10 ENCOUNTER — Other Ambulatory Visit: Payer: Self-pay | Admitting: General Practice

## 2021-03-10 DIAGNOSIS — Z1231 Encounter for screening mammogram for malignant neoplasm of breast: Secondary | ICD-10-CM

## 2021-04-04 ENCOUNTER — Ambulatory Visit: Payer: 59

## 2021-04-14 ENCOUNTER — Ambulatory Visit
Admission: RE | Admit: 2021-04-14 | Discharge: 2021-04-14 | Disposition: A | Discharge status: Home / Self Care | Payer: 59 | Source: Ambulatory Visit | Attending: General Practice | Admitting: General Practice

## 2021-04-14 DIAGNOSIS — Z1231 Encounter for screening mammogram for malignant neoplasm of breast: Secondary | ICD-10-CM

## 2021-05-09 ENCOUNTER — Ambulatory Visit (HOSPITAL_COMMUNITY): Payer: 59 | Admitting: Certified Registered"

## 2021-05-09 ENCOUNTER — Ambulatory Visit (HOSPITAL_BASED_OUTPATIENT_CLINIC_OR_DEPARTMENT_OTHER): Payer: 59 | Admitting: Certified Registered"

## 2021-05-09 ENCOUNTER — Other Ambulatory Visit: Payer: Self-pay

## 2021-05-09 ENCOUNTER — Encounter (HOSPITAL_COMMUNITY)
Admission: RE | Disposition: A | Discharge status: Home / Self Care | Payer: Self-pay | Source: Home / Self Care | Attending: Internal Medicine

## 2021-05-09 ENCOUNTER — Encounter (HOSPITAL_COMMUNITY): Payer: Self-pay | Admitting: Internal Medicine

## 2021-05-09 ENCOUNTER — Ambulatory Visit (HOSPITAL_COMMUNITY)
Admission: RE | Admit: 2021-05-09 | Discharge: 2021-05-09 | Disposition: A | Discharge status: Home / Self Care | Payer: 59 | Attending: Internal Medicine | Admitting: Internal Medicine

## 2021-05-09 DIAGNOSIS — K219 Gastro-esophageal reflux disease without esophagitis: Secondary | ICD-10-CM | POA: Insufficient documentation

## 2021-05-09 DIAGNOSIS — K2289 Other specified disease of esophagus: Secondary | ICD-10-CM | POA: Insufficient documentation

## 2021-05-09 DIAGNOSIS — R12 Heartburn: Secondary | ICD-10-CM | POA: Insufficient documentation

## 2021-05-09 DIAGNOSIS — K3189 Other diseases of stomach and duodenum: Secondary | ICD-10-CM | POA: Insufficient documentation

## 2021-05-09 DIAGNOSIS — F419 Anxiety disorder, unspecified: Secondary | ICD-10-CM | POA: Insufficient documentation

## 2021-05-09 DIAGNOSIS — K317 Polyp of stomach and duodenum: Secondary | ICD-10-CM | POA: Insufficient documentation

## 2021-05-09 DIAGNOSIS — R1310 Dysphagia, unspecified: Secondary | ICD-10-CM | POA: Insufficient documentation

## 2021-05-09 DIAGNOSIS — Z9884 Bariatric surgery status: Secondary | ICD-10-CM | POA: Insufficient documentation

## 2021-05-09 DIAGNOSIS — K920 Hematemesis: Secondary | ICD-10-CM | POA: Diagnosis not present

## 2021-05-09 DIAGNOSIS — K2270 Barrett's esophagus without dysplasia: Secondary | ICD-10-CM

## 2021-05-09 HISTORY — PX: ESOPHAGOGASTRODUODENOSCOPY (EGD) WITH PROPOFOL: SHX5813

## 2021-05-09 SURGERY — ESOPHAGOGASTRODUODENOSCOPY (EGD) WITH PROPOFOL
Anesthesia: Monitor Anesthesia Care

## 2021-05-09 MED ORDER — SODIUM CHLORIDE 0.9 % IV SOLN: INTRAVENOUS | Status: DC

## 2021-05-09 MED ORDER — LACTATED RINGERS IV SOLN: INTRAVENOUS | Status: DC

## 2021-05-09 MED ORDER — LIDOCAINE 2% (20 MG/ML) 5 ML SYRINGE
INTRAMUSCULAR | Status: DC | PRN
  Administered 2021-05-09: 100 mg via INTRAVENOUS

## 2021-05-09 MED ORDER — PROPOFOL 10 MG/ML IV BOLUS
INTRAVENOUS | Status: DC | PRN
  Administered 2021-05-09 (×5): 20 mg via INTRAVENOUS

## 2021-05-09 MED ORDER — PROPOFOL 500 MG/50ML IV EMUL
INTRAVENOUS | Status: DC | PRN
  Administered 2021-05-09: 125 ug/kg/min via INTRAVENOUS

## 2021-05-09 MED ORDER — PROPOFOL 500 MG/50ML IV EMUL
INTRAVENOUS | Status: AC
  Filled 2021-05-09: qty 50

## 2021-05-09 SURGICAL SUPPLY — 15 items

## 2021-05-09 NOTE — Transfer of Care (Signed)
Immediate Anesthesia Transfer of Care Note ? ?Patient: Brianna Allen ? ?Procedure(s) Performed: ESOPHAGOGASTRODUODENOSCOPY (EGD) WITH PROPOFOL ? ?Patient Location: PACU ? ?Anesthesia Type:MAC ? ?Level of Consciousness: awake, alert  and oriented ? ?Airway & Oxygen Therapy: Patient Spontanous Breathing and Patient connected to face mask oxygen ? ?Post-op Assessment: Report given to RN and Post -op Vital signs reviewed and stable ? ?Post vital signs: Reviewed and stable ? ?Last Vitals:  ?Vitals Value Taken Time  ?BP    ?Temp    ?Pulse    ?Resp    ?SpO2    ? ? ?Last Pain:  ?Vitals:  ? 05/09/21 0744  ?TempSrc: Temporal  ?PainSc: 0-No pain  ?   ? ?  ? ?Complications: No notable events documented. ?

## 2021-05-09 NOTE — H&P (Signed)
?[facility] History and Physical ? ? ?Primary Care Physician:  Shellia CleverlyBeane, Lori M, PA ? ? ?Reason for Procedure:   GERD, Barrett's, hematemesis, gastric stenosis ? ?Plan:    EGD ? ? ? ? ?HPI: Brianna GaussKelsey Hollis Allen is a 51 y.o. 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  She has chronic heartburn and reflux and regurgitation symptoms and has difficulty tolerating anything beyond the consistency of mashed potatoes.  She has dysphagia type symptoms.  Upper GI series on 2 occasions has shown a severe gastric stenosis at the site of her laparoscopic band despite all of the fluid being out of the band.  She was to have this "malfunctioning Lap-Band" removed by Dr. Ezzard StandingNewman in 2020 but she says her insurance would not pay for it because it was bariatric in nature and she could not afford it so she never had it done.  She is very concerned about aspiration, fearful she may die from this, fearful about damage to her esophagus because of a diagnosis of Barrett's esophagus (only an irregular Z-line) and would like to have a reassessment.  She is tearful as she speaks.  She also says sometimes she spits up or vomits small amounts of red blood. ?Though she takes her medications sometimes they are regurgitated so question how effective her PPI is.  She also has as needed NSAIDs on her list. ?  ?Here is the history in 2019: ?The patient is a 51 year old white woman status post lap band placement for bariatric weight loss 11 years ago by Dr. Ovidio Kinavid Newman, with a 1-1/2-year history of reflux and regurgitation and some vomiting and dysphagia symptoms.  More regurgitation.  She was seen in [facility] by Dr. Arvid RightBedadrine and had an EGD in late 2018, October 15 with an irregular Z line and intestinal metaplasia.  She says it was very bad experience she did not speak to him much she did not spend much time with her and the consult order after the endoscopy and she received a phone  call but little to no explanation that she needed a repeat EGD in 6 months from the last one.  She saw Dr. Ezzard StandingNewman and is quite upset having problems with symptoms as mentioned above.  She had an upper GI on October 17, 2017 and the LAP-BAND was tight and he took all the fluid out of it.  When I saw her in the office I did not have the timeframe right and I thought that the upper GI was after the fluid was withdrawn.  She says she is about the same pre-and post upper GI however and pre-and post visit with Dr. Ezzard StandingNewman  ?  ?   ?Wt Readings from Last 3 Encounters:  ?03/02/21 226 lb (102.5 kg)  ?05/12/19 220 lb 7 oz (100 kg)  ?10/30/18 233 lb (105.7 kg)  ?  ?  ?Past Medical History:  ?Diagnosis Date  ? Anxiety   ? Arthritis   ? right arm-titanium elbow joint-reduced flexion  ? Barrett's esophagus   ? GERD (gastroesophageal reflux disease)   ? Panic attacks   ? Plantar fasciitis   ? Vitamin D deficiency   ? ? ?Past Surgical History:  ?Procedure Laterality Date  ? BREAST BIOPSY Left   ? CESAREAN SECTION  [phone]  ? CESAREAN SECTION  01/10/2011  ? Procedure: CESAREAN SECTION;  Surgeon: Genia DelMarie-Lyne Lavoie;  Location: [facility];  Service: Gynecology;  Laterality: N/A;  Repeat with  Bilateral  tubal Litgation  ? CHOLECYSTECTOMY  2000  ? DILITATION & CURRETTAGE/HYSTROSCOPY WITH NOVASURE ABLATION N/A 06/17/2015  ? Procedure: DILATATION & CURETTAGE/HYSTEROSCOPY WITH NOVASURE ABLATION;  Surgeon: Genia Del, MD;  Location: [facility];  Service: Gynecology;  Laterality: N/A;  ? ELBOW SURGERY Right 97  ? titanium plate-does not have full extension  ? ESOPHAGOGASTRODUODENOSCOPY  11/2016  ? Dr Reed Breech in [place], [place]  ? FRACTURE SURGERY Right 2002  ? robs  ? JOINT REPLACEMENT Right   ? t bar joint  ? LAPAROSCOPIC GASTRIC BANDING  2009  ? TONSILLECTOMY AND ADENOIDECTOMY    ? TUBAL LIGATION    ? WISDOM TOOTH EXTRACTION    ? ? ?Prior to Admission medications   ?Medication Sig Start Date End Date Taking? Authorizing Provider   ?clonazePAM (KLONOPIN) 1 MG tablet Take 1 mg by mouth daily as needed for anxiety (sleep/anxiety.). 01/19/14  Yes [provider]  ?esomeprazole (NEXIUM) 40 MG capsule Take 1 capsule (40 mg total) by mouth 2 (two) times daily before a meal. Open capsule and swallow granules in liquid or applesauce 30 mins before breakfast and supper 03/06/21  Yes Iva Boop, MD  ?ibuprofen (ADVIL,MOTRIN) 200 MG tablet Take 400-800 mg by mouth every 8 (eight) hours as needed for mild pain (pain.).    Yes [provider]  ?rosuvastatin (CRESTOR) 10 MG tablet Take 10 mg by mouth every evening.  08/19/18  Yes [provider]  ?traMADol (ULTRAM) 50 MG tablet Take 50 mg by mouth every 6 (six) hours as needed for moderate pain. 04/06/20   [provider]  ? ? ?Current Facility-Administered Medications  ?Medication Dose Route Frequency Provider Last Rate Last Admin  ? 0.9 %  sodium chloride infusion   Intravenous Continuous Iva Boop, MD      ? lactated ringers infusion   Intravenous Continuous Iva Boop, MD 10 mL/hr at 05/09/21 0759 New Bag at 05/09/21 0759  ? ? ?Allergies as of 03/02/2021  ? (No Known Allergies)  ? ? ?Family History  ?Problem Relation Age of Onset  ? Leukemia Mother   ? Sjogren's syndrome Maternal Grandmother   ? Leukemia Maternal Grandfather   ? Breast cancer Neg Hx   ? Heart disease Neg Hx   ? Esophageal cancer Neg Hx   ? Colon cancer Neg Hx   ? Stomach cancer Neg Hx   ? Pancreatic cancer Neg Hx   ? ? ?Social History  ? ?Socioeconomic History  ? Marital status: Married  ?  Spouse name: Not on file  ? Number of children: 3  ? Years of education: Not on file  ? Highest education level: Not on file  ?Occupational History  ? Occupation: Homemaker  ?Tobacco Use  ? Smoking status: Never  ? Smokeless tobacco: Never  ?Vaping Use  ? Vaping Use: Never used  ?Substance and Sexual Activity  ? Alcohol use: Yes  ?  Alcohol/week: 5.0 standard drinks  ?  Types: 5 Glasses of wine per  week  ?  Comment: socially wine  ? Drug use: No  ? Sexual activity: Yes  ?  Birth control/protection: Surgical  ?Other Topics Concern  ? Not on file  ?Social History Narrative  ? 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 Some alcohol on the weekends  ? Daily coffee  ? No drug use  ? ?Social Determinants of Health  ? ?Financial  Resource Strain: Not on file  ?Food Insecurity: Not on file  ?Transportation Needs: Not on file  ?Physical Activity: Not on file  ?Stress: Not on file  ?Social Connections: Not on file  ?Intimate Partner Violence: Not on file  ? ? ?Review of Systems: ?Nasal congestion - allergies ?All other review of systems negative except as mentioned in the HPI. ? ?Physical Exam: ?Vital signs ?BP (!) 149/95   Pulse 73   Temp 98.8 ?F (37.1 ?C) (Temporal)   Resp 13   Ht 5\' 9"  (1.753 m)   Wt 102.1 kg   SpO2 100%   BMI 33.23 kg/m?  ? ?General:   Alert,  Well-developed, well-nourished, pleasant and cooperative in NAD ?Lungs:  Clear throughout to auscultation.   ?Heart:  Regular rate and rhythm; no murmurs, clicks, rubs,  or gallops. ?Abdomen:  Soft, nontender and nondistended. Normal bowel sounds.  Lap band port in place RUQ ?Neuro/Psych:  Alert and cooperative. Normal mood and affect. A and O x 3 ? ? ?@Grainne Knights  , MD, ?[facility] ?[phone] (pager) ?05/09/2021 8:34 AM@ ? ?

## 2021-05-09 NOTE — Op Note (Signed)
[facility] ?Patient Name: Brianna Allen ?Procedure Date: 05/09/2021 ?MRN: [id number] ?Attending MD: Gatha Mayer , MD ?Date of Birth: [birth year] ?CSN: [id number] ?Age: 51 ?Admit Type: Outpatient ?Procedure:                Upper GI endoscopy ?Indications:              Heartburn, Suspected gastro-esophageal reflux  ?                          disease, Hematemesis ?Providers:                Gatha Mayer, MD, Mikey College, RN, Elberta Fortis  ?                          Johnella Moloney, Technician ?Referring MD:              ?Medicines:                Monitored Anesthesia Care ?Complications:            No immediate complications. ?Estimated Blood Loss:     Estimated blood loss was minimal. ?Procedure:                Pre-Anesthesia Assessment: ?                          - Prior to the procedure, a History and Physical  ?                          was performed, and patient medications and  ?                          allergies were reviewed. The patient's tolerance of  ?                          previous anesthesia was also reviewed. The risks  ?                          and benefits of the procedure and the sedation  ?                          options and risks were discussed with the patient.  ?                          All questions were answered, and informed consent  ?                          was obtained. Prior Anticoagulants: The patient has  ?                          taken no previous anticoagulant or antiplatelet  ?                          agents. ASA Grade Assessment: II - A patient with  ?                          mild  systemic disease. After reviewing the risks  ?                          and benefits, the patient was deemed in  ?                          satisfactory condition to undergo the procedure. ?                          After obtaining informed consent, the endoscope was  ?                          passed under direct vision. Throughout the  ?                          procedure, the  patient's blood pressure, pulse, and  ?                          oxygen saturations were monitored continuously. The  ?                          [id number]) Olympus endoscope was introduced  ?                          through the mouth, and advanced to the second part  ?                          of duodenum. The upper GI endoscopy was  ?                          accomplished without difficulty. The patient  ?                          tolerated the procedure fairly well. ?Scope In: ?Scope Out: ?Findings: ?     The Z-line was irregular and was found 40 cm from the incisors. Biopsies  ?     were taken with a cold forceps for histology. Verification of patient  ?     identification for the specimen was done. Estimated blood loss was  ?     minimal. ?     Evidence of a lap band stenosis were found in the gastric body. This was  ?     characterized by healthy appearing mucosa and moderate stenosis. ?     A single diminutive sessile polyp with no stigmata of recent bleeding  ?     was found in the cardia. Biopsies were taken with a cold forceps for  ?     histology. Verification of patient identification for the specimen was  ?     done. Estimated blood loss was minimal. ?     Patchy mildly erythematous mucosa was found in the gastric antrum.  ?     Biopsies were taken with a cold forceps for histology. Verification of  ?     patient identification for the specimen was done. Estimated blood loss  ?     was minimal. ?     The exam was otherwise without abnormality. ?  The cardia and gastric fundus were normal on retroflexion. ?Impression:               - Z-line irregular, 40 cm from the incisors.  ?                          Biopsied. Does not look like Barrett's esophagus as  ?                          dx elsewhere - no columnar change > 1 cm ?                          - A lap band stenosis were found, characterized by  ?                          moderate stenosis and healthy appearing mucosa. ?                           - A single gastric polyp. Biopsied. ?                          - Erythematous mucosa in the antrum. Biopsied. ?                          - The examination was otherwise normal. ?Moderate Sedation: ?     Not Applicable - Patient had care per Anesthesia. ?Recommendation:           - Patient has a contact number available for  ?                          emergencies. The signs and symptoms of potential  ?                          delayed complications were discussed with the  ?                          patient. Return to normal activities tomorrow.  ?                          Written discharge instructions were provided to the  ?                          patient. ?                          - Resume previous diet. ?                          - Continue present medications. ?                          - Await pathology results. Still think lap band  ?                          should be removed but she says insurance will not  ?  cover. ?Procedure Code(s):        --- Professional --- ?                          [phone], Esophagogastroduodenoscopy, flexible,  ?                          transoral; with biopsy, single or multiple ?Diagnosis Code(s):        --- Professional --- ?                          K22.8, Other specified diseases of esophagus ?                          K31.7, Polyp of stomach and duodenum ?                          K31.89, Other diseases of stomach and duodenum ?                          R12, Heartburn ?                          K92.0, Hematemesis ?CPT copyright 2019 American Medical Association. All rights reserved. ?The codes documented in this report are preliminary and upon coder review may  ?be revised to meet current compliance requirements. ?Gatha Mayer, MD ?05/09/2021 9:14:44 AM ?This report has been signed electronically. ?Number of Addenda: 0 ?

## 2021-05-09 NOTE — Discharge Instructions (Signed)
Nothing bad seen. ?I took stomach biopsies (? Gastritis, small polyp) and esophagus biopsies. I am not convinced you have barrett's esophagus - I think that was incorrect. ? ?The lap band area is narrowed but scope passes. ? ?Once I get biopsy results I will discuss more with you. ? ?I appreciate the opportunity to care for you. ?Iva Boop, MD, Clementeen Graham ? ?YOU HAD AN ENDOSCOPIC PROCEDURE TODAY: Refer to the procedure report and other information in the discharge instructions given to you for any specific questions about what was found during the examination. If this information does not answer your questions, please call Dr. Marvell Fuller office at [phone] to clarify.  ? ?YOU SHOULD EXPECT: Some feelings of bloating in the abdomen. Passage of more gas than usual. Walking can help get rid of the air that was put into your GI tract during the procedure and reduce the bloating. If you had a lower endoscopy (such as a colonoscopy or flexible sigmoidoscopy) you may notice spotting of blood in your stool or on the toilet paper. Some abdominal soreness may be present for a day or two, also. ? ?DIET: Your first meal following the procedure should be a light meal and then it is ok to progress to your normal diet. A half-sandwich or bowl of soup is an example of a good first meal. Heavy or fried foods are harder to digest and may make you feel nauseous or bloated. Drink plenty of fluids but you should avoid alcoholic beverages for 24 hours.  ? ?ACTIVITY: Your care partner should take you home directly after the procedure. You should plan to take it easy, moving slowly for the rest of the day. You can resume normal activity the day after the procedure however YOU SHOULD NOT DRIVE, use power tools, machinery or perform tasks that involve climbing or major physical exertion for 24 hours (because of the sedation medicines used during the test).  ? ?SYMPTOMS TO REPORT IMMEDIATELY: ?A gastroenterologist can be reached at any  hour. Please call [phone]  for any of the following symptoms:  ? ?Following upper endoscopy (EGD, EUS, ERCP, esophageal dilation) ?Vomiting of blood or coffee ground material  ?New, significant abdominal pain  ?New, significant chest pain or pain under the shoulder blades  ?Painful or persistently difficult swallowing  ?New shortness of breath  ?Black, tarry-looking or red, bloody stools ? ? ? ?

## 2021-05-09 NOTE — Anesthesia Postprocedure Evaluation (Signed)
Anesthesia Post Note ? ?Patient: Theia Dezeeuw ? ?Procedure(s) Performed: ESOPHAGOGASTRODUODENOSCOPY (EGD) WITH PROPOFOL ? ?  ? ?Patient location during evaluation: PACU ?Anesthesia Type: MAC ?Level of consciousness: awake and alert ?Pain management: pain level controlled ?Vital Signs Assessment: post-procedure vital signs reviewed and stable ?Respiratory status: spontaneous breathing, nonlabored ventilation, respiratory function stable and patient connected to nasal cannula oxygen ?Cardiovascular status: stable and blood pressure returned to baseline ?Postop Assessment: no apparent nausea or vomiting ?Anesthetic complications: no ? ? ?No notable events documented. ? ?Last Vitals:  ?Vitals:  ? 05/09/21 0920 05/09/21 0928  ?BP: (!) 168/74 (!) 167/73  ?Pulse: 63 60  ?Resp: 16 11  ?Temp:    ?SpO2: 100% 97%  ?  ?Last Pain:  ?Vitals:  ? 05/09/21 0928  ?TempSrc:   ?PainSc: 0-No pain  ? ? ?  ?  ?  ?  ?  ?  ? ?Shaylan Tutton S ? ? ? ? ?

## 2021-05-09 NOTE — Anesthesia Preprocedure Evaluation (Signed)
Anesthesia Evaluation  ?Patient identified by MRN, date of birth, ID band ?Patient awake ? ? ? ?Reviewed: ?Allergy & Precautions, NPO status , Patient's Chart, lab work & pertinent test results ? ?Airway ?Mallampati: II ? ?TM Distance: >3 FB ?Neck ROM: Full ? ? ? Dental ?no notable dental hx. ? ?  ?Pulmonary ?neg pulmonary ROS,  ?  ?Pulmonary exam normal ?breath sounds clear to auscultation ? ? ? ? ? ? Cardiovascular ?negative cardio ROS ?Normal cardiovascular exam ?Rhythm:Regular Rate:Normal ? ? ?  ?Neuro/Psych ?Anxiety negative neurological ROS ?   ? GI/Hepatic ?Neg liver ROS, GERD  Medicated,  ?Endo/Other  ?negative endocrine ROS ? Renal/GU ?negative Renal ROS  ?negative genitourinary ?  ?Musculoskeletal ?negative musculoskeletal ROS ?(+)  ? Abdominal ?  ?Peds ?negative pediatric ROS ?(+)  Hematology ?negative hematology ROS ?(+)   ?Anesthesia Other Findings ? ? Reproductive/Obstetrics ?negative OB ROS ? ?  ? ? ? ? ? ? ? ? ? ? ? ? ? ?  ?  ? ? ? ? ? ? ? ? ?Anesthesia Physical ?Anesthesia Plan ? ?ASA: 2 ? ?Anesthesia Plan: MAC  ? ?Post-op Pain Management: Minimal or no pain anticipated  ? ?Induction: Intravenous ? ?PONV Risk Score and Plan: 2 and Propofol infusion and Treatment may vary due to age or medical condition ? ?Airway Management Planned: Nasal Cannula ? ?Additional Equipment:  ? ?Intra-op Plan:  ? ?Post-operative Plan:  ? ?Informed Consent: I have reviewed the patients History and Physical, chart, labs and discussed the procedure including the risks, benefits and alternatives for the proposed anesthesia with the patient or authorized representative who has indicated his/her understanding and acceptance.  ? ? ? ?Dental advisory given ? ?Plan Discussed with: CRNA and Surgeon ? ?Anesthesia Plan Comments:   ? ? ? ? ? ? ?Anesthesia Quick Evaluation ? ?

## 2021-05-10 ENCOUNTER — Encounter (HOSPITAL_COMMUNITY): Payer: Self-pay | Admitting: Internal Medicine

## 2021-05-11 LAB — SURGICAL PATHOLOGY

## 2021-05-16 ENCOUNTER — Other Ambulatory Visit: Payer: Self-pay

## 2021-05-16 ENCOUNTER — Telehealth: Payer: Self-pay

## 2021-05-16 DIAGNOSIS — B9681 Helicobacter pylori [H. pylori] as the cause of diseases classified elsewhere: Secondary | ICD-10-CM

## 2021-05-16 MED ORDER — METRONIDAZOLE 250 MG PO TABS: 250.0000 mg | ORAL_TABLET | Freq: Four times a day (QID) | ORAL | 0 refills | Status: AC

## 2021-05-16 MED ORDER — BISMUTH SUBSALICYLATE 262 MG PO CHEW: 524.0000 mg | CHEWABLE_TABLET | Freq: Three times a day (TID) | ORAL | 0 refills | Status: AC

## 2021-05-16 MED ORDER — DOXYCYCLINE HYCLATE 100 MG PO CAPS: 100.0000 mg | ORAL_CAPSULE | Freq: Two times a day (BID) | ORAL | 0 refills | Status: AC

## 2021-05-16 NOTE — Telephone Encounter (Signed)
Pt called to ask when she would be due for her H. Pylori stool test. Let pt know this would be around 06/27/21. Pt stated her husband is going to start a new job and she will be without insurance from May 1st- July 1st and doesn't want to complete the stool test without insurance. Pt wanted to know what to do if she is unable to complete stool test 4 weeks after treatment. Pt is concerned since this is her 3rd time being diagnosed with H. Pylori and wants to make sure it is gone. Please advise.  ?

## 2021-05-16 NOTE — Telephone Encounter (Signed)
Spoke with pt and let her know Dr. Marvell Fuller message. Pt verbalized understanding and had no other concerns at end of call. Reminder placed.  ?

## 2021-05-16 NOTE — Telephone Encounter (Signed)
We should wait at least 4 weeks after she completes treatment to check so it is fine to check in July. ? ?I recommend the following: ? ?Given her vomiting and reflux issues she should stay on her generic Nexium until 2 weeks before she does the H. pylori test.  She must be off all acid blocking medication 2 weeks before the test to be sure it is accurate. ? ?Also please tell her she does not have Barrett's esophagus.  That was an incorrect diagnosis in the past. ?

## 2021-05-23 ENCOUNTER — Encounter: Payer: Self-pay | Admitting: Internal Medicine

## 2021-07-24 ENCOUNTER — Telehealth: Payer: Self-pay

## 2021-07-24 NOTE — Telephone Encounter (Signed)
Left message for pt to call back  °

## 2021-07-24 NOTE — Telephone Encounter (Signed)
-----   Message from Orion Modest, RN sent at 05/16/2021  9:17 AM EDT ----- Regarding: lab Pt needs to get H. Pylori stool test in July due to insurance reasons. Need to call pt and remind her to stop nexium so she can get stool test in 2 weeks on 08/07/21. Order for H. Pylori stool test in epic.

## 2021-07-26 NOTE — Telephone Encounter (Signed)
Left message for pt to call back  °

## 2021-07-27 NOTE — Telephone Encounter (Signed)
Left message for pt to call back  °

## 2022-05-15 ENCOUNTER — Other Ambulatory Visit: Payer: Self-pay | Admitting: General Practice

## 2022-05-15 DIAGNOSIS — Z1231 Encounter for screening mammogram for malignant neoplasm of breast: Secondary | ICD-10-CM

## 2022-06-26 ENCOUNTER — Ambulatory Visit
Admission: RE | Admit: 2022-06-26 | Discharge: 2022-06-26 | Disposition: A | Discharge status: Home / Self Care | Payer: BC Managed Care – PPO | Source: Ambulatory Visit | Attending: General Practice | Admitting: General Practice

## 2022-06-26 DIAGNOSIS — Z1231 Encounter for screening mammogram for malignant neoplasm of breast: Secondary | ICD-10-CM

## 2023-08-29 ENCOUNTER — Other Ambulatory Visit: Payer: Self-pay | Admitting: General Practice

## 2023-08-29 DIAGNOSIS — Z1231 Encounter for screening mammogram for malignant neoplasm of breast: Secondary | ICD-10-CM

## 2023-09-10 ENCOUNTER — Ambulatory Visit
Admission: RE | Admit: 2023-09-10 | Discharge: 2023-09-10 | Disposition: A | Discharge status: Home / Self Care | Source: Ambulatory Visit | Attending: General Practice | Admitting: General Practice

## 2023-09-10 DIAGNOSIS — Z1231 Encounter for screening mammogram for malignant neoplasm of breast: Secondary | ICD-10-CM

## 2023-09-13 ENCOUNTER — Other Ambulatory Visit: Payer: Self-pay | Admitting: General Practice

## 2023-09-13 DIAGNOSIS — R928 Other abnormal and inconclusive findings on diagnostic imaging of breast: Secondary | ICD-10-CM

## 2023-09-13 DIAGNOSIS — N6320 Unspecified lump in the left breast, unspecified quadrant: Secondary | ICD-10-CM

## 2023-09-20 ENCOUNTER — Ambulatory Visit
Admission: RE | Admit: 2023-09-20 | Discharge: 2023-09-20 | Disposition: A | Discharge status: Home / Self Care | Source: Ambulatory Visit | Attending: General Practice | Admitting: General Practice

## 2023-09-20 DIAGNOSIS — R928 Other abnormal and inconclusive findings on diagnostic imaging of breast: Secondary | ICD-10-CM

## 2023-09-21 ENCOUNTER — Other Ambulatory Visit: Payer: Self-pay | Admitting: General Practice

## 2023-09-21 DIAGNOSIS — N6489 Other specified disorders of breast: Secondary | ICD-10-CM
